# Patient Record
Sex: Female | Born: 1985 | Race: White | Hispanic: No | Marital: Single | Smoking: Never smoker
Health system: Southern US, Community
[De-identification: ages and names within clinical notes are randomized; demographics above are authoritative.]

## PROBLEM LIST (undated history)

## (undated) DIAGNOSIS — S0181XA Laceration without foreign body of other part of head, initial encounter: Secondary | ICD-10-CM

## (undated) DIAGNOSIS — S022XXA Fracture of nasal bones, initial encounter for closed fracture: Secondary | ICD-10-CM

## (undated) HISTORY — PX: WISDOM TOOTH EXTRACTION: SHX21

---

## 2020-02-28 ENCOUNTER — Other Ambulatory Visit: Payer: Self-pay

## 2020-02-28 ENCOUNTER — Emergency Department (HOSPITAL_COMMUNITY)
Admission: EM | Admit: 2020-02-28 | Discharge: 2020-02-29 | Disposition: A | Discharge status: Home / Self Care | Payer: Managed Care, Other (non HMO) | Attending: Emergency Medicine | Admitting: Emergency Medicine

## 2020-02-28 ENCOUNTER — Encounter (HOSPITAL_COMMUNITY): Payer: Self-pay | Admitting: Emergency Medicine

## 2020-02-28 ENCOUNTER — Emergency Department (HOSPITAL_COMMUNITY): Payer: Managed Care, Other (non HMO)

## 2020-02-28 DIAGNOSIS — S022XXA Fracture of nasal bones, initial encounter for closed fracture: Secondary | ICD-10-CM | POA: Insufficient documentation

## 2020-02-28 DIAGNOSIS — Y999 Unspecified external cause status: Secondary | ICD-10-CM | POA: Diagnosis not present

## 2020-02-28 DIAGNOSIS — W1830XA Fall on same level, unspecified, initial encounter: Secondary | ICD-10-CM | POA: Diagnosis not present

## 2020-02-28 DIAGNOSIS — U071 COVID-19: Secondary | ICD-10-CM | POA: Diagnosis not present

## 2020-02-28 DIAGNOSIS — Y9301 Activity, walking, marching and hiking: Secondary | ICD-10-CM | POA: Insufficient documentation

## 2020-02-28 DIAGNOSIS — R42 Dizziness and giddiness: Secondary | ICD-10-CM | POA: Insufficient documentation

## 2020-02-28 DIAGNOSIS — Z23 Encounter for immunization: Secondary | ICD-10-CM | POA: Diagnosis not present

## 2020-02-28 DIAGNOSIS — S0181XA Laceration without foreign body of other part of head, initial encounter: Secondary | ICD-10-CM | POA: Insufficient documentation

## 2020-02-28 DIAGNOSIS — Y929 Unspecified place or not applicable: Secondary | ICD-10-CM | POA: Insufficient documentation

## 2020-02-28 DIAGNOSIS — S022XXB Fracture of nasal bones, initial encounter for open fracture: Secondary | ICD-10-CM

## 2020-02-28 LAB — CBC WITH DIFFERENTIAL/PLATELET
Abs Immature Granulocytes: 0.07 10*3/uL (ref 0.00–0.07)
Basophils Absolute: 0.1 10*3/uL (ref 0.0–0.1)
Basophils Relative: 1 %
Eosinophils Absolute: 0.1 10*3/uL (ref 0.0–0.5)
Eosinophils Relative: 0 %
HCT: 38.7 % (ref 36.0–46.0)
Hemoglobin: 12.9 g/dL (ref 12.0–15.0)
Immature Granulocytes: 1 %
Lymphocytes Relative: 17 %
Lymphs Abs: 2.4 10*3/uL (ref 0.7–4.0)
MCH: 27.6 pg (ref 26.0–34.0)
MCHC: 33.3 g/dL (ref 30.0–36.0)
MCV: 82.9 fL (ref 80.0–100.0)
Monocytes Absolute: 0.8 10*3/uL (ref 0.1–1.0)
Monocytes Relative: 5 %
Neutro Abs: 10.9 10*3/uL — ABNORMAL HIGH (ref 1.7–7.7)
Neutrophils Relative %: 76 %
Platelets: 340 10*3/uL (ref 150–400)
RBC: 4.67 MIL/uL (ref 3.87–5.11)
RDW: 13.2 % (ref 11.5–15.5)
WBC: 14.2 10*3/uL — ABNORMAL HIGH (ref 4.0–10.5)
nRBC: 0 % (ref 0.0–0.2)

## 2020-02-28 LAB — BASIC METABOLIC PANEL
Anion gap: 12 (ref 5–15)
BUN: 9 mg/dL (ref 6–20)
CO2: 22 mmol/L (ref 22–32)
Calcium: 9.7 mg/dL (ref 8.9–10.3)
Chloride: 105 mmol/L (ref 98–111)
Creatinine, Ser: 0.94 mg/dL (ref 0.44–1.00)
GFR calc Af Amer: >60 mL/min (ref >60)
GFR calc non Af Amer: >60 mL/min (ref >60)
Glucose, Bld: 123 mg/dL — ABNORMAL HIGH (ref 70–99)
Potassium: 3.9 mmol/L (ref 3.5–5.1)
Sodium: 139 mmol/L (ref 135–145)

## 2020-02-28 LAB — I-STAT BETA HCG BLOOD, ED (MC, WL, AP ONLY): I-stat hCG, quantitative: <5 m[IU]/mL (ref <5)

## 2020-02-28 MED ORDER — LIDOCAINE-EPINEPHRINE (PF) 2 %-1:200000 IJ SOLN
INTRAMUSCULAR | Status: AC
  Filled 2020-02-28: qty 20

## 2020-02-28 MED ORDER — HYDROCODONE-ACETAMINOPHEN 5-325 MG PO TABS
1.0000 | ORAL_TABLET | Freq: Once | ORAL | Status: AC
  Administered 2020-02-28: 1 via ORAL
  Filled 2020-02-28: qty 1

## 2020-02-28 MED ORDER — TETANUS-DIPHTH-ACELL PERTUSSIS 5-2.5-18.5 LF-MCG/0.5 IM SUSP
0.5000 mL | Freq: Once | INTRAMUSCULAR | Status: AC
  Administered 2020-02-28: 0.5 mL via INTRAMUSCULAR
  Filled 2020-02-28: qty 0.5

## 2020-02-28 MED ORDER — CEFAZOLIN SODIUM-DEXTROSE 2-4 GM/100ML-% IV SOLN
2.0000 g | Freq: Once | INTRAVENOUS | Status: AC
  Administered 2020-02-28: 2 g via INTRAVENOUS
  Filled 2020-02-28: qty 100

## 2020-02-28 MED ORDER — LIDOCAINE-EPINEPHRINE (PF) 1 %-1:200000 IJ SOLN
20.0000 mL | Freq: Once | INTRAMUSCULAR | Status: DC
  Filled 2020-02-28: qty 20

## 2020-02-28 NOTE — ED Notes (Signed)
Pt is in triage 

## 2020-02-28 NOTE — ED Triage Notes (Signed)
Pt brought to ED by EMS after having a fall, pt landed on her face, extensive laceration noticed to pt's left side of her face mostly close to her nose and lip. Nose deformity noticed.

## 2020-02-28 NOTE — ED Provider Notes (Signed)
[facility] EMERGENCY DEPARTMENT Provider Note   CSN: [id number] Arrival date & time: 02/28/20  2020     History Chief Complaint  Patient presents with  . Fall    Julie Brock is a 34 y.o. female with no significant past medical history who presents to the ED after a mechanical fall that occurred just prior to arrival. Patient states she tripped over an uneven ground where her shoe got caught causing her to fall and hit her face. She was wearing glasses which penetrated her skin.  Denies loss of consciousness.  She is not currently any blood thinners.  Denies nausea and vomiting following the accident.  She rates her pain a 3/10.  No treatment prior to arrival. Denies headache, neck and back pain. Unsure when last tetanus shot was. Admits to some dizziness because she isn't wearing her glasses. Pain is worse with palpation.   History obtained from patient and past medical records. No interpreter used during encounter.      History reviewed. No pertinent past medical history.  There are no problems to display for this patient.   History reviewed. No pertinent surgical history.   OB History   No obstetric history on file.     No family history on file.  Social History   Tobacco Use  . Smoking status: Never Smoker  . Smokeless tobacco: Never Used  Substance Use Topics  . Alcohol use: Never  . Drug use: Never    Home Medications Prior to Admission medications   Not on File    Allergies    Patient has no known allergies.  Review of Systems   Review of Systems  Musculoskeletal: Negative for back pain and neck pain.  Skin: Positive for color change and wound.  Neurological: Positive for dizziness. Negative for facial asymmetry, speech difficulty and headaches.  All other systems reviewed and are negative.   Physical Exam Updated Vital Signs BP (!) 139/100   Pulse 85   Temp 98.8 F (37.1 C) (Oral)   Resp (!) 22   Ht 5\' 6"  (1.676 m)   Wt  104.3 kg   SpO2 98%   BMI 37.12 kg/m   Physical Exam Vitals and nursing note reviewed.  Constitutional:      General: She is not in acute distress.    Appearance: She is not toxic-appearing.  HENT:     Head: Normocephalic.     Comments: No battle sign, raccoon eyes, hemotympanum, or malocclusion.    Nose:     Comments: 2cm deep laceration into left nasal ala which almost goes through to mucus membranes with slight fascia barrier. Numerous superficial lacerations to forearm and nose.     Mouth/Throat:     Comments: No visible dental trauma.  Eyes:     Extraocular Movements: Extraocular movements intact.     Pupils: Pupils are equal, round, and reactive to light.  Neck:     Comments: No cervical midline tenderness. Cardiovascular:     Rate and Rhythm: Normal rate and regular rhythm.     Pulses: Normal pulses.     Heart sounds: Normal heart sounds. No murmur. No friction rub. No gallop.   Pulmonary:     Effort: Pulmonary effort is normal.     Breath sounds: Normal breath sounds.  Abdominal:     General: Abdomen is flat. Bowel sounds are normal. There is no distension.     Palpations: Abdomen is soft.     Tenderness: There is no  abdominal tenderness. There is no guarding or rebound.  Musculoskeletal:     Cervical back: Neck supple.     Comments: No thoracic or lumbar midline tenderness.   Skin:    General: Skin is warm and dry.  Neurological:     General: No focal deficit present.     Mental Status: She is alert.     Comments: Speech is clear, able to follow commands CN III-XII intact Normal strength in upper and lower extremities bilaterally including dorsiflexion and plantar flexion, strong and equal grip strength Sensation grossly intact throughout Moves extremities without ataxia, coordination intact No pronator drift   Psychiatric:        Mood and Affect: Mood normal.        Behavior: Behavior normal.           ED Results / Procedures / Treatments    Labs (all labs ordered are listed, but only abnormal results are displayed) Labs Reviewed  CBC WITH DIFFERENTIAL/PLATELET - Abnormal; Notable for the following components:      Result Value   WBC 14.2 (*)    Neutro Abs 10.9 (*)    All other components within normal limits  BASIC METABOLIC PANEL - Abnormal; Notable for the following components:   Glucose, Bld 123 (*)    All other components within normal limits  SARS CORONAVIRUS 2 BY RT PCR (HOSPITAL ORDER, PERFORMED IN [facility])  I-STAT BETA HCG BLOOD, ED ([facility], [facility], [facility] ONLY)    EKG EKG Interpretation  Date/Time:  Sunday Feb 28 2020 20:23:09 EDT Ventricular Rate:  84 PR Interval:  176 QRS Duration: 74 QT Interval:  374 QTC Calculation: 441 R Axis:   63 Text Interpretation: Normal sinus rhythm Normal ECG No old tracing to compare Confirmed by Melene PlanFloyd, Dan [phone]([id number]) on 02/28/2020 9:44:37 PM   Radiology CT Head Wo Contrast  Result Date: 02/28/2020 CLINICAL DATA:  34 year old female with facial trauma. EXAM: CT HEAD WITHOUT CONTRAST CT MAXILLOFACIAL WITHOUT CONTRAST CT CERVICAL SPINE WITHOUT CONTRAST TECHNIQUE: Multidetector CT imaging of the head, cervical spine, and maxillofacial structures were performed using the standard protocol without intravenous contrast. Multiplanar CT image reconstructions of the cervical spine and maxillofacial structures were also generated. COMPARISON:  None. FINDINGS: CT HEAD FINDINGS Brain: No evidence of acute infarction, hemorrhage, hydrocephalus, extra-axial collection or mass lesion/mass effect. Vascular: No hyperdense vessel or unexpected calcification. Skull: Normal. Negative for fracture or focal lesion. Other: Minimal left forehead contusion. CT MAXILLOFACIAL FINDINGS Osseous: There are fractures of the bilateral nasal bone and anterior nasal septum with mild deviation of the nose to the right. No other acute fracture identified. No mandibular subluxation. Orbits: The globes and  retro-orbital fat are preserved. Sinuses: There is blood product in the anterior nasal passages. The paranasal sinuses and mastoid air cells are otherwise clear. Soft tissues: There is laceration of the skin over the nose and laceration of the soft tissues of the left side of the nose. Multiple tiny radiopaque fragment in the soft tissues of the left nasal fold and left upper lip concerning for retained foreign fragments. Clinical correlation recommended. CT CERVICAL SPINE FINDINGS Alignment: No acute subluxation. There is straightening of normal cervical lordosis. This may be positional or due to muscle spasm. Skull base and vertebrae: No acute fracture Soft tissues and spinal canal: No prevertebral fluid or swelling. No visible canal hematoma. Disc levels: Mild degenerative changes primarily at C4-C5 and C5-C6 with bone spurring. Upper chest: Negative. Other: Bilateral mildly enlarged cervical  lymph nodes measuring up to 13 mm (46/4) on the left. This is indeterminate in etiology. Clinical correlation is recommended. There is heterogeneity of the thyroid gland with probable several nodules. Recommend thyroid US (Ref: J Am Coll Radiol. 2015 Feb;12(2): 143-50). IMPRESSION: 1. Normal unenhanced CT of the brain. 2. No acute/traumatic cervical spine pathology. 3. Fractures of the bilateral nasal bone and anterior nasal septum with mild deviation of the nose to the right. 4. Multiple tiny radiopaque fragment in the soft tissues of the left nasal fold and left upper lip concerning for retained foreign fragments. Clinical correlation recommended. 5. Heterogeneous thyroid gland with multiple nodules. Further evaluation with ultrasound on a nonemergent/outpatient basis recommended. 6. Bilateral cervical adenopathy of indeterminate etiology. Clinical correlation is recommended. Electronically Signed   By: Elgie Collard M.D.   On: 02/28/2020 21:33   CT Cervical Spine Wo Contrast  Result Date: 02/28/2020 CLINICAL DATA:   34 year old female with facial trauma. EXAM: CT HEAD WITHOUT CONTRAST CT MAXILLOFACIAL WITHOUT CONTRAST CT CERVICAL SPINE WITHOUT CONTRAST TECHNIQUE: Multidetector CT imaging of the head, cervical spine, and maxillofacial structures were performed using the standard protocol without intravenous contrast. Multiplanar CT image reconstructions of the cervical spine and maxillofacial structures were also generated. COMPARISON:  None. FINDINGS: CT HEAD FINDINGS Brain: No evidence of acute infarction, hemorrhage, hydrocephalus, extra-axial collection or mass lesion/mass effect. Vascular: No hyperdense vessel or unexpected calcification. Skull: Normal. Negative for fracture or focal lesion. Other: Minimal left forehead contusion. CT MAXILLOFACIAL FINDINGS Osseous: There are fractures of the bilateral nasal bone and anterior nasal septum with mild deviation of the nose to the right. No other acute fracture identified. No mandibular subluxation. Orbits: The globes and retro-orbital fat are preserved. Sinuses: There is blood product in the anterior nasal passages. The paranasal sinuses and mastoid air cells are otherwise clear. Soft tissues: There is laceration of the skin over the nose and laceration of the soft tissues of the left side of the nose. Multiple tiny radiopaque fragment in the soft tissues of the left nasal fold and left upper lip concerning for retained foreign fragments. Clinical correlation recommended. CT CERVICAL SPINE FINDINGS Alignment: No acute subluxation. There is straightening of normal cervical lordosis. This may be positional or due to muscle spasm. Skull base and vertebrae: No acute fracture Soft tissues and spinal canal: No prevertebral fluid or swelling. No visible canal hematoma. Disc levels: Mild degenerative changes primarily at C4-C5 and C5-C6 with bone spurring. Upper chest: Negative. Other: Bilateral mildly enlarged cervical lymph nodes measuring up to 13 mm (46/4) on the left. This is  indeterminate in etiology. Clinical correlation is recommended. There is heterogeneity of the thyroid gland with probable several nodules. Recommend thyroid US (Ref: J Am Coll Radiol. 2015 Feb;12(2): 143-50). IMPRESSION: 1. Normal unenhanced CT of the brain. 2. No acute/traumatic cervical spine pathology. 3. Fractures of the bilateral nasal bone and anterior nasal septum with mild deviation of the nose to the right. 4. Multiple tiny radiopaque fragment in the soft tissues of the left nasal fold and left upper lip concerning for retained foreign fragments. Clinical correlation recommended. 5. Heterogeneous thyroid gland with multiple nodules. Further evaluation with ultrasound on a nonemergent/outpatient basis recommended. 6. Bilateral cervical adenopathy of indeterminate etiology. Clinical correlation is recommended. Electronically Signed   By: Elgie Collard M.D.   On: 02/28/2020 21:33   CT Maxillofacial Wo Contrast  Result Date: 02/28/2020 CLINICAL DATA:  34 year old female with facial trauma. EXAM: CT HEAD WITHOUT CONTRAST CT MAXILLOFACIAL WITHOUT CONTRAST  CT CERVICAL SPINE WITHOUT CONTRAST TECHNIQUE: Multidetector CT imaging of the head, cervical spine, and maxillofacial structures were performed using the standard protocol without intravenous contrast. Multiplanar CT image reconstructions of the cervical spine and maxillofacial structures were also generated. COMPARISON:  None. FINDINGS: CT HEAD FINDINGS Brain: No evidence of acute infarction, hemorrhage, hydrocephalus, extra-axial collection or mass lesion/mass effect. Vascular: No hyperdense vessel or unexpected calcification. Skull: Normal. Negative for fracture or focal lesion. Other: Minimal left forehead contusion. CT MAXILLOFACIAL FINDINGS Osseous: There are fractures of the bilateral nasal bone and anterior nasal septum with mild deviation of the nose to the right. No other acute fracture identified. No mandibular subluxation. Orbits: The globes  and retro-orbital fat are preserved. Sinuses: There is blood product in the anterior nasal passages. The paranasal sinuses and mastoid air cells are otherwise clear. Soft tissues: There is laceration of the skin over the nose and laceration of the soft tissues of the left side of the nose. Multiple tiny radiopaque fragment in the soft tissues of the left nasal fold and left upper lip concerning for retained foreign fragments. Clinical correlation recommended. CT CERVICAL SPINE FINDINGS Alignment: No acute subluxation. There is straightening of normal cervical lordosis. This may be positional or due to muscle spasm. Skull base and vertebrae: No acute fracture Soft tissues and spinal canal: No prevertebral fluid or swelling. No visible canal hematoma. Disc levels: Mild degenerative changes primarily at C4-C5 and C5-C6 with bone spurring. Upper chest: Negative. Other: Bilateral mildly enlarged cervical lymph nodes measuring up to 13 mm (46/4) on the left. This is indeterminate in etiology. Clinical correlation is recommended. There is heterogeneity of the thyroid gland with probable several nodules. Recommend thyroid US (Ref: J Am Coll Radiol. 2015 Feb;12(2): 143-50). IMPRESSION: 1. Normal unenhanced CT of the brain. 2. No acute/traumatic cervical spine pathology. 3. Fractures of the bilateral nasal bone and anterior nasal septum with mild deviation of the nose to the right. 4. Multiple tiny radiopaque fragment in the soft tissues of the left nasal fold and left upper lip concerning for retained foreign fragments. Clinical correlation recommended. 5. Heterogeneous thyroid gland with multiple nodules. Further evaluation with ultrasound on a nonemergent/outpatient basis recommended. 6. Bilateral cervical adenopathy of indeterminate etiology. Clinical correlation is recommended. Electronically Signed   By: Anner Crete M.D.   On: 02/28/2020 21:33    Procedures Procedures (including critical care  time)  Medications Ordered in ED Medications  lidocaine-EPINEPHrine (XYLOCAINE-EPINEPHrine) 1 %-1:200000 (PF) injection 20 mL (20 mLs Infiltration Not Given 02/28/20 2307)  ceFAZolin (ANCEF) IVPB 2g/100 mL premix (0 g Intravenous Stopped 02/28/20 2302)  Tdap (BOOSTRIX) injection 0.5 mL (0.5 mLs Intramuscular Given 02/28/20 2206)  HYDROcodone-acetaminophen (NORCO/VICODIN) 5-325 MG per tablet 1 tablet (1 tablet Oral Given 02/28/20 2203)  lidocaine-EPINEPHrine (XYLOCAINE W/EPI) 2 %-1:200000 (PF) injection (  Given 02/28/20 2307)    ED Course  I have reviewed the triage vital signs and the nursing notes.  Pertinent labs & imaging results that were available during my care of the patient were reviewed by me and considered in my medical decision making (see chart for details).  Clinical Course as of Feb 28 2352  Sun Feb 28, 2020  2213 Discussed case with Dr. Marla Roe who agrees to see patient for suture repair.    [CA]    Clinical Course User Index [CA] Suzy Bouchard, PA-C   MDM Rules/Calculators/A&P  34 year old female presents to the ED via EMS after a mechanical fall that occurred just prior to arrival. Patient fell directly on her face causing numerous lacerations to her face, most significant around left nasal ala.  No loss of consciousness.  She is not currently on any blood thinners.  Stable vitals.  Patient no acute distress and nontoxic-appearing.  2 cm deep laceration into left nasal ala that almost goes through to the mucous membrane.  Numerous superficial lacerations to forehead and bridge of nose.  No signs of basilar skull fracture on exam.  No cervical, thoracic, or lumbar midline tenderness.  Normal neurological exam.  Labs and images ordered at triage.  Tetanus shot updated here in the ED.  Patient started on IV Ancef.  Hydrocodone given for pain management.  Discussed case with Dr. Ulice Bold who agrees to repair patient in the ED. CT maxillofacial,  cervical spine, and head personally reviewed which demonstrates: IMPRESSION:  1. Normal unenhanced CT of the brain.  2. No acute/traumatic cervical spine pathology.  3. Fractures of the bilateral nasal bone and anterior nasal septum  with mild deviation of the nose to the right.  4. Multiple tiny radiopaque fragment in the soft tissues of the left  nasal fold and left upper lip concerning for retained foreign  fragments. Clinical correlation recommended.  5. Heterogeneous thyroid gland with multiple nodules. Further  evaluation with ultrasound on a nonemergent/outpatient basis  recommended.  6. Bilateral cervical adenopathy of indeterminate etiology. Clinical  correlation is recommended.   CBC reassuring with mild leukocytosis at 14.2 likely due to stress reaction.  BMP reassuring with normal renal function no major electrolyte derangements.  Pregnancy test negative.  Covid test pending.  EKG personally reviewed which demonstrates normal sinus rhythm with no signs of acute ischemia.  Discussed case with Dr. Adela Lank who evaluated patient at bedside and agrees with assessment and plan.   Dr. Ulice Bold performed suture repair. Will discharge patient with antibiotics and pain medication. ENT and Dr. Kittie Plater number given to patient at discharge. Strict ED precautions discussed with patient. Patient states understanding and agrees to plan. Patient discharged home in no acute distress and stable vitals  Final Clinical Impression(s) / ED Diagnoses Final diagnoses:  Facial laceration, initial encounter  Open fracture of nasal bone, initial encounter    Rx / DC Orders ED Discharge Orders    None       Mannie Stabile, PA-C 02/29/20 0014    Melene Plan, DO 02/29/20 1504    Melene Plan, DO 02/29/20 1506

## 2020-02-29 LAB — SARS CORONAVIRUS 2 BY RT PCR (HOSPITAL ORDER, PERFORMED IN ~~LOC~~ HOSPITAL LAB): SARS Coronavirus 2: POSITIVE — AB

## 2020-02-29 MED ORDER — NAPROXEN 500 MG PO TABS: 500.0000 mg | ORAL_TABLET | Freq: Two times a day (BID) | ORAL | 0 refills | Status: AC

## 2020-02-29 MED ORDER — AMOXICILLIN-POT CLAVULANATE 875-125 MG PO TABS
1.0000 | ORAL_TABLET | Freq: Two times a day (BID) | ORAL | 0 refills | Status: DC
Start: 2020-02-29 — End: 2020-03-30

## 2020-02-29 MED ORDER — HYDROCODONE-ACETAMINOPHEN 5-325 MG PO TABS
1.0000 | ORAL_TABLET | Freq: Once | ORAL | Status: AC
  Administered 2020-02-29: 1 via ORAL
  Filled 2020-02-29: qty 1

## 2020-02-29 MED ORDER — HYDROCODONE-ACETAMINOPHEN 5-325 MG PO TABS: 1.0000 | ORAL_TABLET | ORAL | 0 refills | Status: AC | PRN

## 2020-02-29 NOTE — Discharge Instructions (Addendum)
As discussed, I have included the number for Dr. Ulice Bold. Please call on Tuesday to schedule an appointment. I have also included the number of the ENT doctor. Call to schedule an appointment for further evaluation of your broken nose. I am sending you home with antibiotics and pain medication. Take as prescribed. Return to the ER for new or worsening symptoms.   You tested positive for COVID-19 today.  You need to quarantine at home for a total of 10 days from when your symptoms began.  If you are not having any symptoms prior to today, it would be 10 days from today.  You can take Tylenol and ibuprofen if you develop a fever.  Return to the emergency department if you develop trouble breathing, if your fingers or lips turn blue, if you pass out, if you develop uncontrollable vomiting become unable to eat or stop making urine, or other new, concerning symptoms.

## 2020-02-29 NOTE — ED Notes (Signed)
Discharge instructions discussed with pt. Instructions included prescription, pain management and follow up care. Pt' COVID+. Discussed quarantine required. Pt to go home with significant other at bedside.

## 2020-03-01 ENCOUNTER — Other Ambulatory Visit: Payer: Self-pay

## 2020-03-01 ENCOUNTER — Ambulatory Visit (INDEPENDENT_AMBULATORY_CARE_PROVIDER_SITE_OTHER): Payer: Managed Care, Other (non HMO) | Admitting: Plastic Surgery

## 2020-03-01 ENCOUNTER — Telehealth: Payer: Self-pay | Admitting: Physician Assistant

## 2020-03-01 ENCOUNTER — Encounter: Payer: Self-pay | Admitting: Plastic Surgery

## 2020-03-01 DIAGNOSIS — S022XXA Fracture of nasal bones, initial encounter for closed fracture: Secondary | ICD-10-CM | POA: Insufficient documentation

## 2020-03-01 DIAGNOSIS — S0181XA Laceration without foreign body of other part of head, initial encounter: Secondary | ICD-10-CM

## 2020-03-01 NOTE — Procedures (Signed)
DATE OF OPERATION: 03/01/2020  LOCATION: [facility]  PREOPERATIVE DIAGNOSIS: Facial lacerations  POSTOPERATIVE DIAGNOSIS: Same  PROCEDURE: Complex repair of facial lacerations 1. Complex repair of left nasal ala laceration 2 cm 2. Repair of left nasal wall laceration 3 cm 3. Repair of oral buccal laceration 1 cm 4. Repair of upper lip laceration 1 cm  SURGEON: Claire Sanger Dillingham, DO  EBL: 2 cc  CONDITION: Stable  COMPLICATIONS: None  INDICATION: The patient, Julie Brock, is a 34 y.o. female born on 02-Oct-1985, is here for treatment of complex facial lacerations after falling on her face and sustaining cuts from her glasses.  She also has a nasal fracture.   PROCEDURE DETAILS:  The patient was seen in the emergency room.  The procedure was explained and consent obtained. A standard time out was performed and all information was confirmed.   The patient was placed in a comfortable supine position.  Her face was prepped with Betadine.  Lidocaine 1% with epinephrine was injected in the laceration sites of the nose, forehead and upper lip.  The areas were further cleaned with Betadine due to some gravel in the wounds.  The forehead abrasion was cleaned but sutures were not placed.  There were additionally 2 areas on the nose that had skin missing and therefore the areas were cleaned and not closed with sutures.  Left nasal ala: There was a 2 cm area of stellate laceration.  There was destruction of a portion of the tissue.  Approximately 5 mm was trimmed.  Special care was taken to realign the structures.  There was a portion of the nasal ala at the inferior most aspect at the nasal opening that was lacerated.  A deep layer of 5-0 Vicryl was used to reapproximate the muscle layer of the upper lip to the lower nasal ala insertion.  The skin was reapproximated at the lateral nasal ala using 5-0 Monocryl sutures.  Oral buccal mucosa: A 4-0 Vicryl was used to close the 1 cm buccal  mucosa laceration on the maxillary aspect.  This communicated with the nasal ala laceration.  Left nasal sidewall: The 3 cm laceration was reapproximated using 5-0 Monocryl simple interrupted sutures due to the tension and irregularity of the laceration.  Upper lip: The stellate laceration was repaired with a combination of 5-0 Monocryl and 5-0 Vicryl as the vermilion border was involved in the 1 cm laceration.  All areas were dressed with Vaseline and dry gauze.  The patient was allowed to wake up and taken to recovery room in stable condition at the end of the case. The family was notified at the end of the case.   The 21st Century Cures Act was signed into law in 2016 which includes the topic of electronic health records.  This provides immediate access to information in MyChart.  This includes consultation notes, operative notes, office notes, lab results and pathology reports.  If you have any questions about what you read please let us know at your next visit or call us at the office.  We are right here with you.

## 2020-03-01 NOTE — Telephone Encounter (Signed)
Called to discuss with Edmon Crape about Covid symptoms and the use of bamlanivimab/etesevimab or casirivimab/imdevimab, a monoclonal antibody infusion for those with mild to moderate Covid symptoms and at a high risk of hospitalization.    Pt does not qualify for infusion therapy as pt has asymptomatic infection. Isolation precautions discussed. Advised to contact back for consideration should they develop symptoms. Patient verbalized understanding. If she goes on to develop symptoms, she has our hotline number. (qualification is BMI >25)     Patient Active Problem List   Diagnosis Date Noted  . Facial laceration 03/01/2020  . Nasal fracture 03/01/2020    Cline Crock PA-C

## 2020-03-01 NOTE — Consult Note (Signed)
Reason for Consult: Facial trauma Referring Physician: Dr. Eliseo Squires  Julie Brock is an 34 y.o. female.  HPI:  The patient is a 34 yrs old wf here for treatment of her facial injuries.  She was playing miniature golf when she tripped and fell.  She sustained multiple facial lacerations from her glasses.  She had abrasions on her forehead, upper lip and nose.  There were lacerations on her left lateral nose and upper lip  History reviewed. No pertinent past medical history.  History reviewed. No pertinent surgical history.  No family history on file.  Social History:  reports that she has never smoked. She has never used smokeless tobacco. She reports that she does not drink alcohol or use drugs.  Allergies: No Known Allergies  Medications: I have reviewed the patient's current medications.  Results for orders placed or performed during the hospital encounter of 02/28/20 (from the past 48 hour(s))  CBC with Differential     Status: Abnormal   Collection Time: 02/28/20  9:28 PM  Result Value Ref Range   WBC 14.2 (H) 4.0 - 10.5 K/uL   RBC 4.67 3.87 - 5.11 MIL/uL   Hemoglobin 12.9 12.0 - 15.0 g/dL   HCT 38.7 36.0 - 46.0 %   MCV 82.9 80.0 - 100.0 fL   MCH 27.6 26.0 - 34.0 pg   MCHC 33.3 30.0 - 36.0 g/dL   RDW 13.2 11.5 - 15.5 %   Platelets 340 150 - 400 K/uL   nRBC 0.0 0.0 - 0.2 %   Neutrophils Relative % 76 %   Neutro Abs 10.9 (H) 1.7 - 7.7 K/uL   Lymphocytes Relative 17 %   Lymphs Abs 2.4 0.7 - 4.0 K/uL   Monocytes Relative 5 %   Monocytes Absolute 0.8 0.1 - 1.0 K/uL   Eosinophils Relative 0 %   Eosinophils Absolute 0.1 0.0 - 0.5 K/uL   Basophils Relative 1 %   Basophils Absolute 0.1 0.0 - 0.1 K/uL   Immature Granulocytes 1 %   Abs Immature Granulocytes 0.07 0.00 - 0.07 K/uL    Comment: Performed at [facility], [address], [place], [place] [address]  Basic metabolic panel     Status: Abnormal   Collection Time: 02/28/20  9:28 PM  Result Value Ref Range   Sodium 139 135 - 145 mmol/L   Potassium 3.9 3.5 - 5.1 mmol/L   Chloride 105 98 - 111 mmol/L   CO2 22 22 - 32 mmol/L   Glucose, Bld 123 (H) 70 - 99 mg/dL    Comment: Glucose reference range applies only to samples taken after fasting for at least 8 hours.   BUN 9 6 - 20 mg/dL   Creatinine, Ser 0.94 0.44 - 1.00 mg/dL   Calcium 9.7 8.9 - 10.3 mg/dL   GFR calc non Af Amer >60 >60 mL/min   GFR calc Af Amer >60 >60 mL/min   Anion gap 12 5 - 15    Comment: Performed at [facility] [address] [place], [place] [address]  I-Stat Beta hCG blood, ED ([facility], [facility], [facility] only)     Status: None   Collection Time: 02/28/20  9:31 PM  Result Value Ref Range   I-stat hCG, quantitative <5.0 <5 mIU/mL   Comment 3            Comment:   GEST. AGE      CONC.  (mIU/mL)   <=1 WEEK  5 - 50     2 WEEKS       50 - 500     3 WEEKS       100 - 10,000     4 WEEKS     1,000 - 30,000        FEMALE AND NON-PREGNANT FEMALE:     LESS THAN 5 mIU/mL   SARS Coronavirus 2 by RT PCR (hospital order, performed in [facility] hospital lab) Nasopharyngeal Nasopharyngeal Swab     Status: Abnormal   Collection Time: 02/28/20 11:19 PM   Specimen: Nasopharyngeal Swab  Result Value Ref Range   SARS Coronavirus 2 POSITIVE (A) NEGATIVE    Comment: RESULT CALLED TO, READ BACK BY AND VERIFIED WITH: RN BAILEY OFORIO AT 0021 BY MESSAN H. ON 02/29/2020 (NOTE) SARS-CoV-2 target nucleic acids are DETECTED SARS-CoV-2 RNA is generally detectable in upper respiratory specimens  during the acute phase of infection.  Positive results are indicative  of the presence of the identified virus, but do not rule out bacterial infection or co-infection with other pathogens not detected by the test.  Clinical correlation with patient history and  other diagnostic information is necessary to determine patient infection status.  The expected result is negative. Fact Sheet for Patients:   BoilerBrush.com.cy  Fact  Sheet for Healthcare Providers:   https://pope.com/   This test is not yet approved or cleared by the [place] FDA and  has been authorized for detection and/or diagnosis of SARS-CoV-2 by FDA under an Emergency Use Authorization (EUA).  This EUA will remain in effect (meaning  this test can be used) for the duration of  the COVID-19 declaration under Section 564(b)(1) of the Act, 21 U.S.C. section 360-bbb-3(b)(1), unless the authorization is terminated or revoked sooner. Performed at [facility], [address] [place], [place] [address]     CT Head Wo Contrast  Result Date: 02/28/2020 CLINICAL DATA:  34 year old female with facial trauma. EXAM: CT HEAD WITHOUT CONTRAST CT MAXILLOFACIAL WITHOUT CONTRAST CT CERVICAL SPINE WITHOUT CONTRAST TECHNIQUE: Multidetector CT imaging of the head, cervical spine, and maxillofacial structures were performed using the standard protocol without intravenous contrast. Multiplanar CT image reconstructions of the cervical spine and maxillofacial structures were also generated. COMPARISON:  None. FINDINGS: CT HEAD FINDINGS Brain: No evidence of acute infarction, hemorrhage, hydrocephalus, extra-axial collection or mass lesion/mass effect. Vascular: No hyperdense vessel or unexpected calcification. Skull: Normal. Negative for fracture or focal lesion. Other: Minimal left forehead contusion. CT MAXILLOFACIAL FINDINGS Osseous: There are fractures of the bilateral nasal bone and anterior nasal septum with mild deviation of the nose to the right. No other acute fracture identified. No mandibular subluxation. Orbits: The globes and retro-orbital fat are preserved. Sinuses: There is blood product in the anterior nasal passages. The paranasal sinuses and mastoid air cells are otherwise clear. Soft tissues: There is laceration of the skin over the nose and laceration of the soft tissues of the left side of the nose. Multiple tiny radiopaque  fragment in the soft tissues of the left nasal fold and left upper lip concerning for retained foreign fragments. Clinical correlation recommended. CT CERVICAL SPINE FINDINGS Alignment: No acute subluxation. There is straightening of normal cervical lordosis. This may be positional or due to muscle spasm. Skull base and vertebrae: No acute fracture Soft tissues and spinal canal: No prevertebral fluid or swelling. No visible canal hematoma. Disc levels: Mild degenerative changes primarily at C4-C5 and C5-C6 with bone spurring. Upper chest:  Negative. Other: Bilateral mildly enlarged cervical lymph nodes measuring up to 13 mm (46/4) on the left. This is indeterminate in etiology. Clinical correlation is recommended. There is heterogeneity of the thyroid gland with probable several nodules. Recommend thyroid US (Ref: J Am Coll Radiol. 2015 Feb;12(2): 143-50). IMPRESSION: 1. Normal unenhanced CT of the brain. 2. No acute/traumatic cervical spine pathology. 3. Fractures of the bilateral nasal bone and anterior nasal septum with mild deviation of the nose to the right. 4. Multiple tiny radiopaque fragment in the soft tissues of the left nasal fold and left upper lip concerning for retained foreign fragments. Clinical correlation recommended. 5. Heterogeneous thyroid gland with multiple nodules. Further evaluation with ultrasound on a nonemergent/outpatient basis recommended. 6. Bilateral cervical adenopathy of indeterminate etiology. Clinical correlation is recommended. Electronically Signed   By: Elgie Collard M.D.   On: 02/28/2020 21:33   CT Cervical Spine Wo Contrast  Result Date: 02/28/2020 CLINICAL DATA:  34 year old female with facial trauma. EXAM: CT HEAD WITHOUT CONTRAST CT MAXILLOFACIAL WITHOUT CONTRAST CT CERVICAL SPINE WITHOUT CONTRAST TECHNIQUE: Multidetector CT imaging of the head, cervical spine, and maxillofacial structures were performed using the standard protocol without intravenous contrast.  Multiplanar CT image reconstructions of the cervical spine and maxillofacial structures were also generated. COMPARISON:  None. FINDINGS: CT HEAD FINDINGS Brain: No evidence of acute infarction, hemorrhage, hydrocephalus, extra-axial collection or mass lesion/mass effect. Vascular: No hyperdense vessel or unexpected calcification. Skull: Normal. Negative for fracture or focal lesion. Other: Minimal left forehead contusion. CT MAXILLOFACIAL FINDINGS Osseous: There are fractures of the bilateral nasal bone and anterior nasal septum with mild deviation of the nose to the right. No other acute fracture identified. No mandibular subluxation. Orbits: The globes and retro-orbital fat are preserved. Sinuses: There is blood product in the anterior nasal passages. The paranasal sinuses and mastoid air cells are otherwise clear. Soft tissues: There is laceration of the skin over the nose and laceration of the soft tissues of the left side of the nose. Multiple tiny radiopaque fragment in the soft tissues of the left nasal fold and left upper lip concerning for retained foreign fragments. Clinical correlation recommended. CT CERVICAL SPINE FINDINGS Alignment: No acute subluxation. There is straightening of normal cervical lordosis. This may be positional or due to muscle spasm. Skull base and vertebrae: No acute fracture Soft tissues and spinal canal: No prevertebral fluid or swelling. No visible canal hematoma. Disc levels: Mild degenerative changes primarily at C4-C5 and C5-C6 with bone spurring. Upper chest: Negative. Other: Bilateral mildly enlarged cervical lymph nodes measuring up to 13 mm (46/4) on the left. This is indeterminate in etiology. Clinical correlation is recommended. There is heterogeneity of the thyroid gland with probable several nodules. Recommend thyroid US (Ref: J Am Coll Radiol. 2015 Feb;12(2): 143-50). IMPRESSION: 1. Normal unenhanced CT of the brain. 2. No acute/traumatic cervical spine pathology. 3.  Fractures of the bilateral nasal bone and anterior nasal septum with mild deviation of the nose to the right. 4. Multiple tiny radiopaque fragment in the soft tissues of the left nasal fold and left upper lip concerning for retained foreign fragments. Clinical correlation recommended. 5. Heterogeneous thyroid gland with multiple nodules. Further evaluation with ultrasound on a nonemergent/outpatient basis recommended. 6. Bilateral cervical adenopathy of indeterminate etiology. Clinical correlation is recommended. Electronically Signed   By: Elgie Collard M.D.   On: 02/28/2020 21:33   CT Maxillofacial Wo Contrast  Result Date: 02/28/2020 CLINICAL DATA:  34 year old female with facial trauma. EXAM: CT HEAD  WITHOUT CONTRAST CT MAXILLOFACIAL WITHOUT CONTRAST CT CERVICAL SPINE WITHOUT CONTRAST TECHNIQUE: Multidetector CT imaging of the head, cervical spine, and maxillofacial structures were performed using the standard protocol without intravenous contrast. Multiplanar CT image reconstructions of the cervical spine and maxillofacial structures were also generated. COMPARISON:  None. FINDINGS: CT HEAD FINDINGS Brain: No evidence of acute infarction, hemorrhage, hydrocephalus, extra-axial collection or mass lesion/mass effect. Vascular: No hyperdense vessel or unexpected calcification. Skull: Normal. Negative for fracture or focal lesion. Other: Minimal left forehead contusion. CT MAXILLOFACIAL FINDINGS Osseous: There are fractures of the bilateral nasal bone and anterior nasal septum with mild deviation of the nose to the right. No other acute fracture identified. No mandibular subluxation. Orbits: The globes and retro-orbital fat are preserved. Sinuses: There is blood product in the anterior nasal passages. The paranasal sinuses and mastoid air cells are otherwise clear. Soft tissues: There is laceration of the skin over the nose and laceration of the soft tissues of the left side of the nose. Multiple tiny  radiopaque fragment in the soft tissues of the left nasal fold and left upper lip concerning for retained foreign fragments. Clinical correlation recommended. CT CERVICAL SPINE FINDINGS Alignment: No acute subluxation. There is straightening of normal cervical lordosis. This may be positional or due to muscle spasm. Skull base and vertebrae: No acute fracture Soft tissues and spinal canal: No prevertebral fluid or swelling. No visible canal hematoma. Disc levels: Mild degenerative changes primarily at C4-C5 and C5-C6 with bone spurring. Upper chest: Negative. Other: Bilateral mildly enlarged cervical lymph nodes measuring up to 13 mm (46/4) on the left. This is indeterminate in etiology. Clinical correlation is recommended. There is heterogeneity of the thyroid gland with probable several nodules. Recommend thyroid US (Ref: J Am Coll Radiol. 2015 Feb;12(2): 143-50). IMPRESSION: 1. Normal unenhanced CT of the brain. 2. No acute/traumatic cervical spine pathology. 3. Fractures of the bilateral nasal bone and anterior nasal septum with mild deviation of the nose to the right. 4. Multiple tiny radiopaque fragment in the soft tissues of the left nasal fold and left upper lip concerning for retained foreign fragments. Clinical correlation recommended. 5. Heterogeneous thyroid gland with multiple nodules. Further evaluation with ultrasound on a nonemergent/outpatient basis recommended. 6. Bilateral cervical adenopathy of indeterminate etiology. Clinical correlation is recommended. Electronically Signed   By: Elgie Collard M.D.   On: 02/28/2020 21:33    Review of Systems Blood pressure (!) 144/108, pulse 94, temperature 98 F (36.7 C), temperature source Oral, resp. rate 20, height 5\' 6"  (1.676 m), weight 104.3 kg, SpO2 97 %. Physical Exam  Constitutional: She is oriented to person, place, and time. She appears well-developed and well-nourished.  HENT:  Head: Normocephalic.    Eyes: Pupils are equal, round,  and reactive to light. EOM are normal.  Cardiovascular: Normal rate.  Respiratory: Effort normal.  GI: Soft. She exhibits no distension.  Musculoskeletal:        General: Normal range of motion.  Neurological: She is alert and oriented to person, place, and time.  Skin: Skin is warm.  Psychiatric: She has a normal mood and affect. Her behavior is normal. Judgment and thought content normal.    Assessment/Plan: The lacerations were repaired.  I would like to see the patient in the office in two days to apply donated Acell to the abrasions.  Keep the area covered till then.    Cheryle Dark 03/01/2020, 9:15 AM

## 2020-03-01 NOTE — H&P (View-Only) (Signed)
Reason for Consult: Facial trauma Referring Physician: Dr. Osorio Munoz  Julie Brock is an 34 y.o. female.  HPI:  The patient is a 34 yrs old wf here for treatment of her facial injuries.  She was playing miniature golf when she tripped and fell.  She sustained multiple facial lacerations from her glasses.  She had abrasions on her forehead, upper lip and nose.  There were lacerations on her left lateral nose and upper lip  History reviewed. No pertinent past medical history.  History reviewed. No pertinent surgical history.  No family history on file.  Social History:  reports that she has never smoked. She has never used smokeless tobacco. She reports that she does not drink alcohol or use drugs.  Allergies: No Known Allergies  Medications: I have reviewed the patient's current medications.  Results for orders placed or performed during the hospital encounter of 02/28/20 (from the past 48 hour(s))  CBC with Differential     Status: Abnormal   Collection Time: 02/28/20  9:28 PM  Result Value Ref Range   WBC 14.2 (H) 4.0 - 10.5 K/uL   RBC 4.67 3.87 - 5.11 MIL/uL   Hemoglobin 12.9 12.0 - 15.0 g/dL   HCT 38.7 36.0 - 46.0 %   MCV 82.9 80.0 - 100.0 fL   MCH 27.6 26.0 - 34.0 pg   MCHC 33.3 30.0 - 36.0 g/dL   RDW 13.2 11.5 - 15.5 %   Platelets 340 150 - 400 K/uL   nRBC 0.0 0.0 - 0.2 %   Neutrophils Relative % 76 %   Neutro Abs 10.9 (H) 1.7 - 7.7 K/uL   Lymphocytes Relative 17 %   Lymphs Abs 2.4 0.7 - 4.0 K/uL   Monocytes Relative 5 %   Monocytes Absolute 0.8 0.1 - 1.0 K/uL   Eosinophils Relative 0 %   Eosinophils Absolute 0.1 0.0 - 0.5 K/uL   Basophils Relative 1 %   Basophils Absolute 0.1 0.0 - 0.1 K/uL   Immature Granulocytes 1 %   Abs Immature Granulocytes 0.07 0.00 - 0.07 K/uL    Comment: Performed at [facility], [address] [place], [place] [address]  Basic metabolic panel     Status: Abnormal   Collection Time: 02/28/20  9:28 PM  Result Value Ref Range   Sodium 139 135 - 145 mmol/L   Potassium 3.9 3.5 - 5.1 mmol/L   Chloride 105 98 - 111 mmol/L   CO2 22 22 - 32 mmol/L   Glucose, Bld 123 (H) 70 - 99 mg/dL    Comment: Glucose reference range applies only to samples taken after fasting for at least 8 hours.   BUN 9 6 - 20 mg/dL   Creatinine, Ser 0.94 0.44 - 1.00 mg/dL   Calcium 9.7 8.9 - 10.3 mg/dL   GFR calc non Af Amer >60 >60 mL/min   GFR calc Af Amer >60 >60 mL/min   Anion gap 12 5 - 15    Comment: Performed at [facility], [address] , [place] [address]  I-Stat Beta hCG blood, ED ([facility], [facility], [facility] only)     Status: None   Collection Time: 02/28/20  9:31 PM  Result Value Ref Range   I-stat hCG, quantitative <5.0 <5 mIU/mL   Comment 3            Comment:   GEST. AGE      CONC.  (mIU/mL)   <=1 WEEK          5 - 50     2 WEEKS       50 - 500     3 WEEKS       100 - 10,000     4 WEEKS     1,000 - 30,000        FEMALE AND NON-PREGNANT FEMALE:     LESS THAN 5 mIU/mL   SARS Coronavirus 2 by RT PCR (hospital order, performed in [facility] hospital lab) Nasopharyngeal Nasopharyngeal Swab     Status: Abnormal   Collection Time: 02/28/20 11:19 PM   Specimen: Nasopharyngeal Swab  Result Value Ref Range   SARS Coronavirus 2 POSITIVE (A) NEGATIVE    Comment: RESULT CALLED TO, READ BACK BY AND VERIFIED WITH: RN BAILEY OFORIO AT 0021 BY MESSAN H. ON 02/29/2020 (NOTE) SARS-CoV-2 target nucleic acids are DETECTED SARS-CoV-2 RNA is generally detectable in upper respiratory specimens  during the acute phase of infection.  Positive results are indicative  of the presence of the identified virus, but do not rule out bacterial infection or co-infection with other pathogens not detected by the test.  Clinical correlation with patient history and  other diagnostic information is necessary to determine patient infection status.  The expected result is negative. Fact Sheet for Patients:   BoilerBrush.com.cy  Fact  Sheet for Healthcare Providers:   https://pope.com/   This test is not yet approved or cleared by the [place] FDA and  has been authorized for detection and/or diagnosis of SARS-CoV-2 by FDA under an Emergency Use Authorization (EUA).  This EUA will remain in effect (meaning  this test can be used) for the duration of  the COVID-19 declaration under Section 564(b)(1) of the Act, 21 U.S.C. section 360-bbb-3(b)(1), unless the authorization is terminated or revoked sooner. Performed at [facility], [address] [place], [place] [address]     CT Head Wo Contrast  Result Date: 02/28/2020 CLINICAL DATA:  34 year old female with facial trauma. EXAM: CT HEAD WITHOUT CONTRAST CT MAXILLOFACIAL WITHOUT CONTRAST CT CERVICAL SPINE WITHOUT CONTRAST TECHNIQUE: Multidetector CT imaging of the head, cervical spine, and maxillofacial structures were performed using the standard protocol without intravenous contrast. Multiplanar CT image reconstructions of the cervical spine and maxillofacial structures were also generated. COMPARISON:  None. FINDINGS: CT HEAD FINDINGS Brain: No evidence of acute infarction, hemorrhage, hydrocephalus, extra-axial collection or mass lesion/mass effect. Vascular: No hyperdense vessel or unexpected calcification. Skull: Normal. Negative for fracture or focal lesion. Other: Minimal left forehead contusion. CT MAXILLOFACIAL FINDINGS Osseous: There are fractures of the bilateral nasal bone and anterior nasal septum with mild deviation of the nose to the right. No other acute fracture identified. No mandibular subluxation. Orbits: The globes and retro-orbital fat are preserved. Sinuses: There is blood product in the anterior nasal passages. The paranasal sinuses and mastoid air cells are otherwise clear. Soft tissues: There is laceration of the skin over the nose and laceration of the soft tissues of the left side of the nose. Multiple tiny radiopaque  fragment in the soft tissues of the left nasal fold and left upper lip concerning for retained foreign fragments. Clinical correlation recommended. CT CERVICAL SPINE FINDINGS Alignment: No acute subluxation. There is straightening of normal cervical lordosis. This may be positional or due to muscle spasm. Skull base and vertebrae: No acute fracture Soft tissues and spinal canal: No prevertebral fluid or swelling. No visible canal hematoma. Disc levels: Mild degenerative changes primarily at C4-C5 and C5-C6 with bone spurring. Upper chest:  Negative. Other: Bilateral mildly enlarged cervical lymph nodes measuring up to 13 mm (46/4) on the left. This is indeterminate in etiology. Clinical correlation is recommended. There is heterogeneity of the thyroid gland with probable several nodules. Recommend thyroid US (Ref: J Am Coll Radiol. 2015 Feb;12(2): 143-50). IMPRESSION: 1. Normal unenhanced CT of the brain. 2. No acute/traumatic cervical spine pathology. 3. Fractures of the bilateral nasal bone and anterior nasal septum with mild deviation of the nose to the right. 4. Multiple tiny radiopaque fragment in the soft tissues of the left nasal fold and left upper lip concerning for retained foreign fragments. Clinical correlation recommended. 5. Heterogeneous thyroid gland with multiple nodules. Further evaluation with ultrasound on a nonemergent/outpatient basis recommended. 6. Bilateral cervical adenopathy of indeterminate etiology. Clinical correlation is recommended. Electronically Signed   By: Arash  Radparvar M.D.   On: 02/28/2020 21:33   CT Cervical Spine Wo Contrast  Result Date: 02/28/2020 CLINICAL DATA:  34-year-old female with facial trauma. EXAM: CT HEAD WITHOUT CONTRAST CT MAXILLOFACIAL WITHOUT CONTRAST CT CERVICAL SPINE WITHOUT CONTRAST TECHNIQUE: Multidetector CT imaging of the head, cervical spine, and maxillofacial structures were performed using the standard protocol without intravenous contrast.  Multiplanar CT image reconstructions of the cervical spine and maxillofacial structures were also generated. COMPARISON:  None. FINDINGS: CT HEAD FINDINGS Brain: No evidence of acute infarction, hemorrhage, hydrocephalus, extra-axial collection or mass lesion/mass effect. Vascular: No hyperdense vessel or unexpected calcification. Skull: Normal. Negative for fracture or focal lesion. Other: Minimal left forehead contusion. CT MAXILLOFACIAL FINDINGS Osseous: There are fractures of the bilateral nasal bone and anterior nasal septum with mild deviation of the nose to the right. No other acute fracture identified. No mandibular subluxation. Orbits: The globes and retro-orbital fat are preserved. Sinuses: There is blood product in the anterior nasal passages. The paranasal sinuses and mastoid air cells are otherwise clear. Soft tissues: There is laceration of the skin over the nose and laceration of the soft tissues of the left side of the nose. Multiple tiny radiopaque fragment in the soft tissues of the left nasal fold and left upper lip concerning for retained foreign fragments. Clinical correlation recommended. CT CERVICAL SPINE FINDINGS Alignment: No acute subluxation. There is straightening of normal cervical lordosis. This may be positional or due to muscle spasm. Skull base and vertebrae: No acute fracture Soft tissues and spinal canal: No prevertebral fluid or swelling. No visible canal hematoma. Disc levels: Mild degenerative changes primarily at C4-C5 and C5-C6 with bone spurring. Upper chest: Negative. Other: Bilateral mildly enlarged cervical lymph nodes measuring up to 13 mm (46/4) on the left. This is indeterminate in etiology. Clinical correlation is recommended. There is heterogeneity of the thyroid gland with probable several nodules. Recommend thyroid US (Ref: J Am Coll Radiol. 2015 Feb;12(2): 143-50). IMPRESSION: 1. Normal unenhanced CT of the brain. 2. No acute/traumatic cervical spine pathology. 3.  Fractures of the bilateral nasal bone and anterior nasal septum with mild deviation of the nose to the right. 4. Multiple tiny radiopaque fragment in the soft tissues of the left nasal fold and left upper lip concerning for retained foreign fragments. Clinical correlation recommended. 5. Heterogeneous thyroid gland with multiple nodules. Further evaluation with ultrasound on a nonemergent/outpatient basis recommended. 6. Bilateral cervical adenopathy of indeterminate etiology. Clinical correlation is recommended. Electronically Signed   By: Arash  Radparvar M.D.   On: 02/28/2020 21:33   CT Maxillofacial Wo Contrast  Result Date: 02/28/2020 CLINICAL DATA:  34-year-old female with facial trauma. EXAM: CT HEAD   WITHOUT CONTRAST CT MAXILLOFACIAL WITHOUT CONTRAST CT CERVICAL SPINE WITHOUT CONTRAST TECHNIQUE: Multidetector CT imaging of the head, cervical spine, and maxillofacial structures were performed using the standard protocol without intravenous contrast. Multiplanar CT image reconstructions of the cervical spine and maxillofacial structures were also generated. COMPARISON:  None. FINDINGS: CT HEAD FINDINGS Brain: No evidence of acute infarction, hemorrhage, hydrocephalus, extra-axial collection or mass lesion/mass effect. Vascular: No hyperdense vessel or unexpected calcification. Skull: Normal. Negative for fracture or focal lesion. Other: Minimal left forehead contusion. CT MAXILLOFACIAL FINDINGS Osseous: There are fractures of the bilateral nasal bone and anterior nasal septum with mild deviation of the nose to the right. No other acute fracture identified. No mandibular subluxation. Orbits: The globes and retro-orbital fat are preserved. Sinuses: There is blood product in the anterior nasal passages. The paranasal sinuses and mastoid air cells are otherwise clear. Soft tissues: There is laceration of the skin over the nose and laceration of the soft tissues of the left side of the nose. Multiple tiny  radiopaque fragment in the soft tissues of the left nasal fold and left upper lip concerning for retained foreign fragments. Clinical correlation recommended. CT CERVICAL SPINE FINDINGS Alignment: No acute subluxation. There is straightening of normal cervical lordosis. This may be positional or due to muscle spasm. Skull base and vertebrae: No acute fracture Soft tissues and spinal canal: No prevertebral fluid or swelling. No visible canal hematoma. Disc levels: Mild degenerative changes primarily at C4-C5 and C5-C6 with bone spurring. Upper chest: Negative. Other: Bilateral mildly enlarged cervical lymph nodes measuring up to 13 mm (46/4) on the left. This is indeterminate in etiology. Clinical correlation is recommended. There is heterogeneity of the thyroid gland with probable several nodules. Recommend thyroid US (Ref: J Am Coll Radiol. 2015 Feb;12(2): 143-50). IMPRESSION: 1. Normal unenhanced CT of the brain. 2. No acute/traumatic cervical spine pathology. 3. Fractures of the bilateral nasal bone and anterior nasal septum with mild deviation of the nose to the right. 4. Multiple tiny radiopaque fragment in the soft tissues of the left nasal fold and left upper lip concerning for retained foreign fragments. Clinical correlation recommended. 5. Heterogeneous thyroid gland with multiple nodules. Further evaluation with ultrasound on a nonemergent/outpatient basis recommended. 6. Bilateral cervical adenopathy of indeterminate etiology. Clinical correlation is recommended. Electronically Signed   By: Elgie Collard M.D.   On: 02/28/2020 21:33    Review of Systems Blood pressure (!) 144/108, pulse 94, temperature 98 F (36.7 C), temperature source Oral, resp. rate 20, height 5\' 6"  (1.676 m), weight 104.3 kg, SpO2 97 %. Physical Exam  Constitutional: She is oriented to person, place, and time. She appears well-developed and well-nourished.  HENT:  Head: Normocephalic.    Eyes: Pupils are equal, round,  and reactive to light. EOM are normal.  Cardiovascular: Normal rate.  Respiratory: Effort normal.  GI: Soft. She exhibits no distension.  Musculoskeletal:        General: Normal range of motion.  Neurological: She is alert and oriented to person, place, and time.  Skin: Skin is warm.  Psychiatric: She has a normal mood and affect. Her behavior is normal. Judgment and thought content normal.    Assessment/Plan: The lacerations were repaired.  I would like to see the patient in the office in two days to apply donated Acell to the abrasions.  Keep the area covered till then.    Derrisha Foos 03/01/2020, 9:15 AM

## 2020-03-01 NOTE — Progress Notes (Signed)
   Subjective:    Patient ID: Julie Brock, female    DOB: 08-01-1986, 34 y.o.   MRN: [id number]  The patient is a 34 year old female here for follow-up after undergoing repair of multiple severe facial lacerations.  She also has a nasal fracture.  She was playing golf when she tripped and sustained multiple facial lacerations secondary to her classes.  She was seen in the emergency room and repaired.  She had several areas where the repair was not possible because she had actually lost portion of skin.  She also tested positive for Covid while in the emergency room.  She is otherwise in good health.     Review of Systems  Constitutional: Positive for activity change. Negative for appetite change.  HENT: Positive for facial swelling.   Eyes: Negative.  Negative for photophobia and redness.  Respiratory: Negative.   Cardiovascular: Negative for leg swelling.  Gastrointestinal: Negative for abdominal distention.  Endocrine: Negative.   Genitourinary: Negative.   Musculoskeletal: Negative.   Psychiatric/Behavioral: Negative.        Objective:   Physical Exam Vitals reviewed.  HENT:     Head: Normocephalic.  Cardiovascular:     Rate and Rhythm: Normal rate.     Pulses: Normal pulses.  Pulmonary:     Effort: Pulmonary effort is normal.  Skin:    General: Skin is warm.  Neurological:     Mental Status: She is alert and oriented to person, place, and time.  Psychiatric:        Mood and Affect: Mood normal.        Behavior: Behavior normal.        Assessment & Plan:     ICD-10-CM   1. Facial laceration, initial encounter  S01.81XA   2. Closed fracture of nasal bone, initial encounter  S02.2XXA     Donated ACell was applied to her nose.  She is to use [place] dressings daily and I would like to see her back in 2 weeks.  We will discuss her nasal fracture at that time.  It would be difficult to fix it now because we would not be able to do a nasal splint due to her compromised skin.

## 2020-03-14 ENCOUNTER — Ambulatory Visit (INDEPENDENT_AMBULATORY_CARE_PROVIDER_SITE_OTHER): Payer: Managed Care, Other (non HMO) | Admitting: Surgical

## 2020-03-14 ENCOUNTER — Encounter: Payer: Self-pay | Admitting: Surgical

## 2020-03-14 ENCOUNTER — Other Ambulatory Visit: Payer: Self-pay

## 2020-03-14 VITALS — BP 127/92 | HR 73 | Temp 97.1°F

## 2020-03-14 DIAGNOSIS — S022XXA Fracture of nasal bones, initial encounter for closed fracture: Secondary | ICD-10-CM

## 2020-03-14 DIAGNOSIS — S0181XD Laceration without foreign body of other part of head, subsequent encounter: Secondary | ICD-10-CM

## 2020-03-14 NOTE — Progress Notes (Signed)
Patient ID: Julie Brock, female    DOB: 12-16-1985, 34 y.o.   MRN: [id number]  Chief Complaint  Patient presents with  . Follow-up      ICD-10-CM   1. Facial laceration, subsequent encounter  S01.81XD   2. Closed fracture of nasal bone, initial encounter  S02.2XXA     History of Present Illness: Julie Brock is a 34 y.o.  female  with a history of a mechanical fall resulting in multiple lacerations including a full-thickness laceration of her left nasal ala.  Fractures of bilateral nasal bone and anterior nasal septum with mild deviation was noted on CT maxillofacial on 02/28/2020 in the ED. Surgical intervention was postponed due to the inability to safely splint her after repair due to nasal injuries. She presents for evaluation of her repaired facial lacerations and for a preoperative evaluation/discussion about upcoming procedure, closed reduction of nasal fracture.  Patient is scheduled for 03/18/2020 with Dr. Arita Miss.  The patient has not had problems with anesthesia.  She reports a possible history of general anesthesia when she had her wisdom teeth removed.  No family history of malignant hyperthermia.  No family or personal history of DVT/PE.  No family or personal history of bleeding or clotting disorders.  Patient is not on any blood thinners.  No personal history of any cardiac or pulmonary diseases.  Job: Security guard at [employer]  No past surgical history or past medical history.  Patient reports she is healthy other than her most recent fall/nasal fracture   Past Medical History: Allergies: No Known Allergies  Current Medications:  Current Outpatient Medications:  .  amoxicillin-clavulanate (AUGMENTIN) 875-125 MG tablet, Take 1 tablet by mouth every 12 (twelve) hours., Disp: 14 tablet, Rfl: 0 .  HYDROcodone-acetaminophen (NORCO/VICODIN) 5-325 MG tablet, Take 1 tablet by mouth every 4 (four) hours as needed for severe pain., Disp: 10 tablet, Rfl: 0 .  naproxen (NAPROSYN) 500  MG tablet, Take 1 tablet (500 mg total) by mouth 2 (two) times daily., Disp: 30 tablet, Rfl: 0  Past Medical Problems: History reviewed. No pertinent past medical history.  Past Surgical History: History reviewed. No pertinent surgical history.  Social History: Social History   Socioeconomic History  . Marital status: Single    Spouse name: Not on file  . Number of children: Not on file  . Years of education: Not on file  . Highest education level: Not on file  Occupational History  . Not on file  Tobacco Use  . Smoking status: Never Smoker  . Smokeless tobacco: Never Used  Substance and Sexual Activity  . Alcohol use: Never  . Drug use: Never  . Sexual activity: Not on file  Other Topics Concern  . Not on file  Social History Narrative  . Not on file   Social Determinants of Health   Financial Resource Strain:   . Difficulty of Paying Living Expenses:   Food Insecurity:   . Worried About Programme researcher, broadcasting/film/video in the Last Year:   . Barista in the Last Year:   Transportation Needs:   . Freight forwarder (Medical):   Marland Kitchen Lack of Transportation (Non-Medical):   Physical Activity:   . Days of Exercise per Week:   . Minutes of Exercise per Session:   Stress:   . Feeling of Stress :   Social Connections:   . Frequency of Communication with Friends and Family:   . Frequency of Social Gatherings with Friends and  Family:   . Attends Religious Services:   . Active Member of Clubs or Organizations:   . Attends Banker Meetings:   Marland Kitchen Marital Status:   Intimate Partner Violence:   . Fear of Current or Ex-Partner:   . Emotionally Abused:   Marland Kitchen Physically Abused:   . Sexually Abused:     Family History: History reviewed. No pertinent family history.  Review of Systems: Review of Systems  Constitutional: Negative.   Respiratory: Negative.   Cardiovascular: Negative.   Musculoskeletal: Negative.   Neurological: Negative.     Physical  Exam: Vital Signs BP (!) 127/92 (BP Location: Left Arm, Patient Position: Sitting, Cuff Size: Large)   Pulse 73   Temp (!) 97.1 F (36.2 C) (Temporal)   LMP 02/25/2020 (Exact Date)   SpO2 94%   Physical Exam Constitutional:      General: She is not in acute distress.    Appearance: Normal appearance. She is not ill-appearing.  HENT:     Head: Normocephalic.     Nose: No congestion.     Comments: Nasal lacerations noted, healing well. Superficial wound noted near superior nasal bridge. No drainage noted. No erythema. Deviation of nasal tip to the left side. No nasal bruising noted.    Mouth/Throat:     Mouth: Mucous membranes are moist.  Eyes:     Extraocular Movements: Extraocular movements intact.     Conjunctiva/sclera: Conjunctivae normal.     Pupils: Pupils are equal, round, and reactive to light.  Cardiovascular:     Rate and Rhythm: Normal rate.  Pulmonary:     Effort: Pulmonary effort is normal. No respiratory distress.  Musculoskeletal:     Cervical back: Normal range of motion.  Skin:    General: Skin is warm and dry.  Neurological:     General: No focal deficit present.     Mental Status: She is alert and oriented to person, place, and time. Mental status is at baseline.  Psychiatric:        Mood and Affect: Mood normal.        Behavior: Behavior normal.      Assessment/Plan: The patient is scheduled for closed reduction nasal fracture.we discussed the risks, benefits, and alternatives of procedure discussed, questions answered.  Patient provided with the consent form to thoroughly read over and return at her next visit prior to surgery.  Patient is scheduled for closed reduction of nasal fracture on 03/18/2020 with Dr. Arita Miss.  Smoking Status: Non-smoker  Caprini Score: 2; Risk Factors include: Minor surgery, BMI > 25, and length of planned surgery. Recommendation for mechanical prophylaxis during surgery. Encourage early ambulation.   Pictures obtained:  03/14/2020  Post-op Rx sent to pharmacy: No prescription sent at this time, will send at next appointment prior to surgery.  Patient was provided with the General Surgical Risk consent document and Pain Medication Agreement. We discussed them in person together during this preop appointment. Patient was provided with the form to finish reading at home and return with questions at her next appointment. Recommended calling if they have any further questions prior to return appointment.  Risk consent form and Pain Medication Agreement to be scanned into patient's chart once patient has signed and returned.  Pictures were obtained of the patient and placed in the chart with the patient's or guardian's permission.    Recommend scar cream to closed nasal incisions and left medial canthal incision.  Recommend applying Vaseline daily to right nasal bridge wound.  Avoid Neosporin.  Call with questions or concerns.   Electronically signed by: Carola Rhine Iara Monds, PA-C 03/14/2020 4:07 PM

## 2020-03-15 ENCOUNTER — Other Ambulatory Visit: Payer: Self-pay

## 2020-03-15 ENCOUNTER — Encounter (HOSPITAL_BASED_OUTPATIENT_CLINIC_OR_DEPARTMENT_OTHER): Payer: Self-pay | Admitting: Plastic Surgery

## 2020-03-16 ENCOUNTER — Inpatient Hospital Stay (HOSPITAL_COMMUNITY)
Admission: RE | Admit: 2020-03-16 | Discharge: 2020-03-16 | Disposition: A | Discharge status: Home / Self Care | Payer: Managed Care, Other (non HMO) | Source: Ambulatory Visit

## 2020-03-16 NOTE — Progress Notes (Signed)
Patient is a 34 year old female with a history of a mechanical fall resulting in multiple lacerations including a full-thickness laceration of her left nasal alla.  CT on 02/28/2020 in the ED noted fractures of bilateral nasal bones and anterior nasal septum with mild deviation.  Surgical intervention was postponed due to the inability to safely splint her nose after repair due to nasal injuries.  She presented for a preoperative evaluation appointment on 03/14/2020.  At this appointment she was given a copy of the general surgical risks which she took home to review.  She presents today to complete her surgical consent form, have any remaining questions answered for her, and obtain post-op medications.  She reported she still has her pain medications from the ED and at this time does not need any additional. We can call additional in if she runs out post-operatively. She has started using mederma scar cream and noticed a burning pain with use.   On exam, forehead and facial lacerations healing well. She has a scab on right nasal bridge, healing well. No drainage noted. The scar on her left lip, just superior to the vermilion border is raised.  Dr. Arita Miss and I were both present during todays evaluation. She is doing well. We discussed post-operative protocol and all of her questions were answered.   Plan for surgery tomorrow with Dr. Arita Miss. Call with questions or concerns.

## 2020-03-16 NOTE — Progress Notes (Signed)
Patient had a positive covid test on 02/28/20, patient does not need to be retested before her surgery on 03/18/20.

## 2020-03-17 ENCOUNTER — Ambulatory Visit: Payer: Managed Care, Other (non HMO) | Admitting: Plastic Surgery

## 2020-03-17 ENCOUNTER — Ambulatory Visit (INDEPENDENT_AMBULATORY_CARE_PROVIDER_SITE_OTHER): Payer: Managed Care, Other (non HMO) | Admitting: Surgical

## 2020-03-17 ENCOUNTER — Other Ambulatory Visit: Payer: Self-pay

## 2020-03-17 ENCOUNTER — Encounter: Payer: Self-pay | Admitting: Surgical

## 2020-03-17 VITALS — BP 128/83 | HR 77 | Temp 97.8°F

## 2020-03-17 DIAGNOSIS — S022XXA Fracture of nasal bones, initial encounter for closed fracture: Secondary | ICD-10-CM | POA: Diagnosis not present

## 2020-03-17 DIAGNOSIS — S0181XD Laceration without foreign body of other part of head, subsequent encounter: Secondary | ICD-10-CM

## 2020-03-18 ENCOUNTER — Other Ambulatory Visit: Payer: Self-pay

## 2020-03-18 ENCOUNTER — Ambulatory Visit (HOSPITAL_BASED_OUTPATIENT_CLINIC_OR_DEPARTMENT_OTHER): Payer: Managed Care, Other (non HMO) | Admitting: Anesthesiology

## 2020-03-18 ENCOUNTER — Encounter (HOSPITAL_BASED_OUTPATIENT_CLINIC_OR_DEPARTMENT_OTHER)
Admission: RE | Disposition: A | Discharge status: Home / Self Care | Payer: Self-pay | Source: Home / Self Care | Attending: Plastic Surgery

## 2020-03-18 ENCOUNTER — Ambulatory Visit (HOSPITAL_BASED_OUTPATIENT_CLINIC_OR_DEPARTMENT_OTHER)
Admission: RE | Admit: 2020-03-18 | Discharge: 2020-03-18 | Disposition: A | Discharge status: Home / Self Care | Payer: Managed Care, Other (non HMO) | Attending: Plastic Surgery | Admitting: Plastic Surgery

## 2020-03-18 ENCOUNTER — Encounter (HOSPITAL_BASED_OUTPATIENT_CLINIC_OR_DEPARTMENT_OTHER): Payer: Self-pay | Admitting: Plastic Surgery

## 2020-03-18 DIAGNOSIS — Y9353 Activity, golf: Secondary | ICD-10-CM | POA: Insufficient documentation

## 2020-03-18 DIAGNOSIS — Z79899 Other long term (current) drug therapy: Secondary | ICD-10-CM | POA: Insufficient documentation

## 2020-03-18 DIAGNOSIS — Y92838 Other recreation area as the place of occurrence of the external cause: Secondary | ICD-10-CM | POA: Insufficient documentation

## 2020-03-18 DIAGNOSIS — W010XXA Fall on same level from slipping, tripping and stumbling without subsequent striking against object, initial encounter: Secondary | ICD-10-CM | POA: Diagnosis not present

## 2020-03-18 DIAGNOSIS — U071 COVID-19: Secondary | ICD-10-CM | POA: Diagnosis not present

## 2020-03-18 DIAGNOSIS — S022XXA Fracture of nasal bones, initial encounter for closed fracture: Secondary | ICD-10-CM | POA: Insufficient documentation

## 2020-03-18 DIAGNOSIS — S01511A Laceration without foreign body of lip, initial encounter: Secondary | ICD-10-CM | POA: Diagnosis not present

## 2020-03-18 DIAGNOSIS — S0181XA Laceration without foreign body of other part of head, initial encounter: Secondary | ICD-10-CM | POA: Insufficient documentation

## 2020-03-18 DIAGNOSIS — Z6834 Body mass index (BMI) 34.0-34.9, adult: Secondary | ICD-10-CM | POA: Diagnosis not present

## 2020-03-18 HISTORY — PX: CLOSED REDUCTION NASAL FRACTURE: SHX5365

## 2020-03-18 HISTORY — DX: Fracture of nasal bones, initial encounter for closed fracture: S02.2XXA

## 2020-03-18 HISTORY — DX: Laceration without foreign body of other part of head, initial encounter: S01.81XA

## 2020-03-18 LAB — POCT PREGNANCY, URINE: Preg Test, Ur: NEGATIVE

## 2020-03-18 SURGERY — CLOSED REDUCTION, FRACTURE, NASAL BONE
Anesthesia: General | Site: Nose

## 2020-03-18 MED ORDER — ONDANSETRON HCL 4 MG/2ML IJ SOLN
INTRAMUSCULAR | Status: AC
  Filled 2020-03-18: qty 2

## 2020-03-18 MED ORDER — HYDROMORPHONE HCL 1 MG/ML IJ SOLN: 0.2500 mg | INTRAMUSCULAR | Status: DC | PRN

## 2020-03-18 MED ORDER — CEFAZOLIN SODIUM-DEXTROSE 2-4 GM/100ML-% IV SOLN
2.0000 g | INTRAVENOUS | Status: AC
  Administered 2020-03-18: 2 g via INTRAVENOUS

## 2020-03-18 MED ORDER — MIDAZOLAM HCL 5 MG/5ML IJ SOLN
INTRAMUSCULAR | Status: DC | PRN
  Administered 2020-03-18: 2 mg via INTRAVENOUS

## 2020-03-18 MED ORDER — LIDOCAINE-EPINEPHRINE 1 %-1:100000 IJ SOLN
INTRAMUSCULAR | Status: AC
  Filled 2020-03-18: qty 1

## 2020-03-18 MED ORDER — DEXAMETHASONE SODIUM PHOSPHATE 10 MG/ML IJ SOLN
INTRAMUSCULAR | Status: AC
  Filled 2020-03-18: qty 1

## 2020-03-18 MED ORDER — MIDAZOLAM HCL 2 MG/2ML IJ SOLN: 0.5000 mg | Freq: Once | INTRAMUSCULAR | Status: DC | PRN

## 2020-03-18 MED ORDER — ACETAMINOPHEN 500 MG PO TABS
1000.0000 mg | ORAL_TABLET | Freq: Once | ORAL | Status: AC
  Administered 2020-03-18: 1000 mg via ORAL

## 2020-03-18 MED ORDER — LIDOCAINE 2% (20 MG/ML) 5 ML SYRINGE
INTRAMUSCULAR | Status: DC | PRN
  Administered 2020-03-18: 20 mg via INTRAVENOUS

## 2020-03-18 MED ORDER — OXYCODONE HCL 5 MG/5ML PO SOLN: 5.0000 mg | Freq: Once | ORAL | Status: DC | PRN

## 2020-03-18 MED ORDER — ACETAMINOPHEN 500 MG PO TABS
ORAL_TABLET | ORAL | Status: AC
  Filled 2020-03-18: qty 2

## 2020-03-18 MED ORDER — EPHEDRINE 5 MG/ML INJ
INTRAVENOUS | Status: AC
  Filled 2020-03-18: qty 10

## 2020-03-18 MED ORDER — DEXAMETHASONE SODIUM PHOSPHATE 4 MG/ML IJ SOLN
INTRAMUSCULAR | Status: DC | PRN
  Administered 2020-03-18: 5 mg via INTRAVENOUS

## 2020-03-18 MED ORDER — LIDOCAINE-EPINEPHRINE 1 %-1:100000 IJ SOLN
INTRAMUSCULAR | Status: DC | PRN
  Administered 2020-03-18: 10 mL

## 2020-03-18 MED ORDER — CEFAZOLIN SODIUM-DEXTROSE 2-4 GM/100ML-% IV SOLN
INTRAVENOUS | Status: AC
  Filled 2020-03-18: qty 100

## 2020-03-18 MED ORDER — FENTANYL CITRATE (PF) 100 MCG/2ML IJ SOLN
INTRAMUSCULAR | Status: AC
  Filled 2020-03-18: qty 2

## 2020-03-18 MED ORDER — OXYCODONE HCL 5 MG PO TABS: 5.0000 mg | ORAL_TABLET | Freq: Once | ORAL | Status: DC | PRN

## 2020-03-18 MED ORDER — SUCCINYLCHOLINE CHLORIDE 200 MG/10ML IV SOSY
PREFILLED_SYRINGE | INTRAVENOUS | Status: AC
  Filled 2020-03-18: qty 10

## 2020-03-18 MED ORDER — ONDANSETRON HCL 4 MG/2ML IJ SOLN
INTRAMUSCULAR | Status: DC | PRN
  Administered 2020-03-18: 4 mg via INTRAVENOUS

## 2020-03-18 MED ORDER — PROMETHAZINE HCL 25 MG/ML IJ SOLN: 6.2500 mg | INTRAMUSCULAR | Status: DC | PRN

## 2020-03-18 MED ORDER — PROPOFOL 10 MG/ML IV BOLUS
INTRAVENOUS | Status: AC
  Filled 2020-03-18: qty 20

## 2020-03-18 MED ORDER — SCOPOLAMINE 1 MG/3DAYS TD PT72: 1.0000 | MEDICATED_PATCH | Freq: Once | TRANSDERMAL | Status: DC

## 2020-03-18 MED ORDER — MEPERIDINE HCL 25 MG/ML IJ SOLN: 6.2500 mg | INTRAMUSCULAR | Status: DC | PRN

## 2020-03-18 MED ORDER — LIDOCAINE 2% (20 MG/ML) 5 ML SYRINGE
INTRAMUSCULAR | Status: AC
  Filled 2020-03-18: qty 5

## 2020-03-18 MED ORDER — LACTATED RINGERS IV SOLN: INTRAVENOUS | Status: DC

## 2020-03-18 MED ORDER — FENTANYL CITRATE (PF) 100 MCG/2ML IJ SOLN
INTRAMUSCULAR | Status: DC | PRN
  Administered 2020-03-18: 100 ug via INTRAVENOUS

## 2020-03-18 MED ORDER — PHENYLEPHRINE 40 MCG/ML (10ML) SYRINGE FOR IV PUSH (FOR BLOOD PRESSURE SUPPORT)
PREFILLED_SYRINGE | INTRAVENOUS | Status: AC
  Filled 2020-03-18: qty 10

## 2020-03-18 MED ORDER — PROPOFOL 10 MG/ML IV BOLUS
INTRAVENOUS | Status: DC | PRN
  Administered 2020-03-18: 250 mg via INTRAVENOUS

## 2020-03-18 SURGICAL SUPPLY — 52 items
APPLICATOR COTTON TIP 6 STRL (MISCELLANEOUS) ×1 IMPLANT
APPLICATOR COTTON TIP 6IN STRL (MISCELLANEOUS) ×3
BENZOIN TINCTURE PRP APPL 2/3 (GAUZE/BANDAGES/DRESSINGS) ×3 IMPLANT
BLADE SURG 15 STRL LF DISP TIS (BLADE) IMPLANT
BLADE SURG 15 STRL SS (BLADE)
CANISTER SUCT 1200ML W/VALVE (MISCELLANEOUS) ×3 IMPLANT
CLOSURE WOUND 1/2 X4 (GAUZE/BANDAGES/DRESSINGS) ×1
COVER BACK TABLE 60X90IN (DRAPES) ×3 IMPLANT
COVER MAYO STAND STRL (DRAPES) ×3 IMPLANT
COVER WAND RF STERILE (DRAPES) IMPLANT
DECANTER SPIKE VIAL GLASS SM (MISCELLANEOUS) IMPLANT
DERMABOND ADVANCED (GAUZE/BANDAGES/DRESSINGS)
DERMABOND ADVANCED .7 DNX12 (GAUZE/BANDAGES/DRESSINGS) IMPLANT
DRSG NASOPORE 8CM (GAUZE/BANDAGES/DRESSINGS) IMPLANT
ELECT NEEDLE BLADE 2-5/6 (NEEDLE) IMPLANT
ELECT REM PT RETURN 9FT ADLT (ELECTROSURGICAL)
ELECTRODE REM PT RTRN 9FT ADLT (ELECTROSURGICAL) IMPLANT
GAUZE VASELINE FOILPK 1/2 X 72 (GAUZE/BANDAGES/DRESSINGS) IMPLANT
GLOVE BIO SURGEON STRL SZ 6.5 (GLOVE) ×2 IMPLANT
GLOVE BIO SURGEON STRL SZ7.5 (GLOVE) IMPLANT
GLOVE BIO SURGEONS STRL SZ 6.5 (GLOVE) ×1
GLOVE BIOGEL M STRL SZ7.5 (GLOVE) ×3 IMPLANT
GLOVE BIOGEL PI IND STRL 8 (GLOVE) ×1 IMPLANT
GLOVE BIOGEL PI INDICATOR 8 (GLOVE) ×2
GLOVE ECLIPSE 6.5 STRL STRAW (GLOVE) ×3 IMPLANT
GOWN STRL REUS W/ TWL LRG LVL3 (GOWN DISPOSABLE) ×3 IMPLANT
GOWN STRL REUS W/TWL LRG LVL3 (GOWN DISPOSABLE) ×9
KIT SPLINT NASAL DENVER LRG BE (GAUZE/BANDAGES/DRESSINGS) IMPLANT
KIT SPLINT NASAL DENVER MIN BE (GAUZE/BANDAGES/DRESSINGS) IMPLANT
KIT SPLINT NASAL DENVER PET BE (GAUZE/BANDAGES/DRESSINGS) IMPLANT
KIT SPLINT NASAL DENVER SM BEI (GAUZE/BANDAGES/DRESSINGS) ×3 IMPLANT
NEEDLE PRECISIONGLIDE 27X1.5 (NEEDLE) ×3 IMPLANT
PATTIES SURGICAL .5 X3 (DISPOSABLE) ×3 IMPLANT
PENCIL SMOKE EVACUATOR (MISCELLANEOUS) IMPLANT
SET BASIN DAY SURGERY F.S. (CUSTOM PROCEDURE TRAY) ×3 IMPLANT
SHEET MEDIUM DRAPE 40X70 STRL (DRAPES) ×3 IMPLANT
SPLINT NASAL AIRWAY SILICONE (MISCELLANEOUS) IMPLANT
SPLINT NASAL THERMO PLAST (MISCELLANEOUS) IMPLANT
SPONGE GAUZE 2X2 8PLY STER LF (GAUZE/BANDAGES/DRESSINGS)
SPONGE GAUZE 2X2 8PLY STRL LF (GAUZE/BANDAGES/DRESSINGS) IMPLANT
STRIP CLOSURE SKIN 1/2X4 (GAUZE/BANDAGES/DRESSINGS) ×2 IMPLANT
SUT CHROMIC 6 0 PS 4 (SUTURE) IMPLANT
SUT ETHILON 3 0 PS 1 (SUTURE) IMPLANT
SUT SILK 2 0 PERMA HAND 18 BK (SUTURE) IMPLANT
SUT SILK 3 0 SH 30 (SUTURE) IMPLANT
SYR CONTROL 10ML LL (SYRINGE) ×3 IMPLANT
TOWEL GREEN STERILE FF (TOWEL DISPOSABLE) ×3 IMPLANT
TRAY DSU PREP LF (CUSTOM PROCEDURE TRAY) IMPLANT
TUBE CONNECTING 20'X1/4 (TUBING) ×1
TUBE CONNECTING 20X1/4 (TUBING) ×2 IMPLANT
TUBE SALEM SUMP 16 FR W/ARV (TUBING) IMPLANT
YANKAUER SUCT BULB TIP NO VENT (SUCTIONS) IMPLANT

## 2020-03-18 NOTE — Interval H&P Note (Signed)
History and Physical Interval Note:  03/18/2020 12:40 PM  Julie Brock  has presented today for surgery, with the diagnosis of nasal fracture.  The various methods of treatment have been discussed with the patient and family. After consideration of risks, benefits and other options for treatment, the patient has consented to  Procedure(s): CLOSED REDUCTION NASAL FRACTURE (N/A) as a surgical intervention.  The patient's history has been reviewed, patient examined, no change in status, stable for surgery.  I have reviewed the patient's chart and labs.  Questions were answered to the patient's satisfaction.     Allena Napoleon

## 2020-03-18 NOTE — Brief Op Note (Signed)
03/18/2020  3:46 PM  PATIENT:  Julie Brock  34 y.o. female  PRE-OPERATIVE DIAGNOSIS:  nasal fracture  POST-OPERATIVE DIAGNOSIS:  nasal fracture  PROCEDURE:  Procedure(s): CLOSED REDUCTION NASAL FRACTURE (N/A)  SURGEON:  Surgeon(s) and Role:    * Rigby Leonhardt, Wendy Poet, MD - Primary  PHYSICIAN ASSISTANT:  Johanna young, PA  ASSISTANTS: none   ANESTHESIA:   general  EBL:  1 mL   BLOOD ADMINISTERED:none  DRAINS: none   LOCAL MEDICATIONS USED:  LIDOCAINE   SPECIMEN:  No Specimen  DISPOSITION OF SPECIMEN:  N/A  COUNTS:  YES  TOURNIQUET:  * No tourniquets in log *  DICTATION: .Dragon Dictation  PLAN OF CARE: Discharge to home after PACU  PATIENT DISPOSITION:  PACU   Delay start of Pharmacological VTE agent (>24hrs) due to surgical blood loss or risk of bleeding: not applicable

## 2020-03-18 NOTE — Anesthesia Postprocedure Evaluation (Addendum)
Anesthesia Post Note  Patient: Horticulturist, commercial  Procedure(s) Performed: CLOSED REDUCTION NASAL FRACTURE (N/A Nose)     Patient location during evaluation: PACU Anesthesia Type: General Level of consciousness: awake and alert, patient cooperative and oriented Pain management: pain level controlled Vital Signs Assessment: post-procedure vital signs reviewed and stable Respiratory status: spontaneous breathing, nonlabored ventilation and respiratory function stable Cardiovascular status: blood pressure returned to baseline and stable Postop Assessment: no apparent nausea or vomiting Anesthetic complications: no   No complications documented.  Last Vitals:  Vitals:   03/18/20 1530 03/18/20 1545  BP: 119/77   Pulse: 60 63  Resp: 11 15  Temp:    SpO2: 100% 100%    Last Pain:  Vitals:   03/18/20 1157  TempSrc: Oral  PainSc: 0-No pain                 Julie Brock,E. Glorine Hanratty

## 2020-03-18 NOTE — Transfer of Care (Signed)
Immediate Anesthesia Transfer of Care Note  Patient: Julie Brock  Procedure(s) Performed: CLOSED REDUCTION NASAL FRACTURE (N/A Nose)  Patient Location: PACU  Anesthesia Type:General  Level of Consciousness: sedated  Airway & Oxygen Therapy: Patient Spontanous Breathing and Patient connected to face mask oxygen  Post-op Assessment: Report given to RN and Post -op Vital signs reviewed and stable  Post vital signs: Reviewed and stable  Last Vitals:  Vitals Value Taken Time  BP    Temp    Pulse 77 03/18/20 1507  Resp 18 03/18/20 1507  SpO2 99 % 03/18/20 1507  Vitals shown include unvalidated device data.  Last Pain:  Vitals:   03/18/20 1157  TempSrc: Oral  PainSc: 0-No pain         Complications: No complications documented.

## 2020-03-18 NOTE — Anesthesia Procedure Notes (Signed)
Procedure Name: LMA Insertion Date/Time: 03/18/2020 2:33 PM Performed by: Ronnette Hila, CRNA Pre-anesthesia Checklist: Patient identified, Emergency Drugs available, Suction available and Patient being monitored Patient Re-evaluated:Patient Re-evaluated prior to induction Oxygen Delivery Method: Circle system utilized Preoxygenation: Pre-oxygenation with 100% oxygen Induction Type: IV induction Ventilation: Mask ventilation without difficulty LMA: LMA flexible inserted LMA Size: 4.0 Number of attempts: 1 Airway Equipment and Method: Bite block Placement Confirmation: positive ETCO2 Tube secured with: Tape Dental Injury: Teeth and Oropharynx as per pre-operative assessment

## 2020-03-18 NOTE — Anesthesia Preprocedure Evaluation (Addendum)
Anesthesia Evaluation  Patient identified by MRN, date of birth, ID band Patient awake    Reviewed: Allergy & Precautions, NPO status , Patient's Chart, lab work & pertinent test results  History of Anesthesia Complications Negative for: history of anesthetic complications  Airway Mallampati: II  TM Distance: >3 FB Neck ROM: Full    Dental  (+) Dental Advisory Given, Chipped   Pulmonary neg pulmonary ROS,  02/28/2020 SARS coronavirus POS   breath sounds clear to auscultation       Cardiovascular negative cardio ROS   Rhythm:Regular Rate:Normal     Neuro/Psych negative neurological ROS     GI/Hepatic negative GI ROS, Neg liver ROS,   Endo/Other  Morbid obesity  Renal/GU negative Renal ROS     Musculoskeletal   Abdominal (+) + obese,   Peds  Hematology negative hematology ROS (+)   Anesthesia Other Findings   Reproductive/Obstetrics                            Anesthesia Physical Anesthesia Plan  ASA: II  Anesthesia Plan: General   Post-op Pain Management:    Induction: Intravenous  PONV Risk Score and Plan: 3 and Ondansetron and Dexamethasone  Airway Management Planned: LMA  Additional Equipment: None  Intra-op Plan:   Post-operative Plan: Extubation in OR  Informed Consent: I have reviewed the patients History and Physical, chart, labs and discussed the procedure including the risks, benefits and alternatives for the proposed anesthesia with the patient or authorized representative who has indicated his/her understanding and acceptance.     Dental advisory given  Plan Discussed with: CRNA and Surgeon  Anesthesia Plan Comments:        Anesthesia Quick Evaluation

## 2020-03-18 NOTE — Op Note (Signed)
Operative Note   DATE OF OPERATION: 03/18/2020  SURGICAL DEPARTMENT: Plastic Surgery  PREOPERATIVE DIAGNOSES: Displaced nasal fracture  POSTOPERATIVE DIAGNOSES:  same  PROCEDURE: Closed reduction of nasal fracture with splinting  SURGEON: Ancil Linsey, MD  ASSISTANT: Joni Fears, PA  ANESTHESIA:  General.   COMPLICATIONS: None.   INDICATIONS FOR PROCEDURE:  The patient, Julie Brock is a 34 y.o. female born on 1985-11-08, is here for treatment of displaced nasal fracture MRN: [id number]  CONSENT:  Informed consent was obtained directly from the patient. Risks, benefits and alternatives were fully discussed. Specific risks including but not limited to bleeding, infection, hematoma, seroma, scarring, pain, contracture, asymmetry, wound healing problems, and need for further surgery were all discussed. The patient did have an ample opportunity to have questions answered to satisfaction.   DESCRIPTION OF PROCEDURE:  The patient was taken to the operating room. SCDs were placed and antibiotics were given.  General anesthesia was administered.  The patient's operative site was prepped and draped in a sterile fashion. A time out was performed and all information was confirmed to be correct.  I started by infiltrating some of the surrounding areas with lidocaine with epinephrine.  Lidocaine with epi soaked pledgets were also placed intranasally and left in place for period of time.  After this was all given time to work I butter knife was used to realign the nasal dorsum.  The bony vault had been displaced to the patient's right side and this was corrected nicely.  A Denver splint was then applied.  The patient tolerated the procedure well.  There were no complications. The patient was allowed to wake from anesthesia, extubated and taken to the recovery room in satisfactory condition.

## 2020-03-18 NOTE — Discharge Instructions (Signed)
  Post Anesthesia Home Care Instructions  Activity: Get plenty of rest for the remainder of the day. A responsible individual must stay with you for 24 hours following the procedure.  For the next 24 hours, DO NOT: -Drive a car -Advertising copywriter -Drink alcoholic beverages -Take any medication unless instructed by your physician -Make any legal decisions or sign important papers.  Meals: Start with liquid foods such as gelatin or soup. Progress to regular foods as tolerated. Avoid greasy, spicy, heavy foods. If nausea and/or vomiting occur, drink only clear liquids until the nausea and/or vomiting subsides. Call your physician if vomiting continues.  Special Instructions/Symptoms: Your throat may feel dry or sore from the anesthesia or the breathing tube placed in your throat during surgery. If this causes discomfort, gargle with warm salt water. The discomfort should disappear within 24 hours.  If you had a scopolamine patch placed behind your ear for the management of post- operative nausea and/or vomiting:  1. The medication in the patch is effective for 72 hours, after which it should be removed.  Wrap patch in a tissue and discard in the trash. Wash hands thoroughly with soap and water. 2. You may remove the patch earlier than 72 hours if you experience unpleasant side effects which may include dry mouth, dizziness or visual disturbances. 3. Avoid touching the patch. Wash your hands with soap and water after contact with the patch.     Activity: As tolerated, but avoid strenuous activity until follow up visit.  Diet: Regular  Wound Care: Keep dressing clean & dry until nasal splint removed (~ 14 days).  After that you can shower normally.  Redress the wound as needed for comfort.  Special Instructions: Nasal splint will be removed in the office in approximately 2 weeks. If needed, blow nose very gently.  Call our office if any unusual problems occur such as pain,  excessive bleeding, unrelieved nausea/vomiting, fever &/or chills.  Follow-up appointment: Scheduled for 6/25.  Pain Management Plan: 1) Ibuprofen 800 mg every 6 hours OR may use naproxen as prescribed          If still having pain... Add 2) Tylenol 500 mg every 6 hours           If still in pain... Add 3) Rx pain medication - Hydorcodone         Recommend @ night         May use up to every 4 hours if needed for severe pain   You may alternate between Ibuprofen and Tylenol taking one every 3 hours if desired.

## 2020-03-21 ENCOUNTER — Encounter (HOSPITAL_BASED_OUTPATIENT_CLINIC_OR_DEPARTMENT_OTHER): Payer: Self-pay | Admitting: Plastic Surgery

## 2020-03-25 ENCOUNTER — Encounter: Payer: Managed Care, Other (non HMO) | Admitting: Plastic Surgery

## 2020-03-29 ENCOUNTER — Telehealth: Payer: Self-pay | Admitting: Plastic Surgery

## 2020-03-29 NOTE — Progress Notes (Signed)
   Subjective:     Patient ID: Julie Brock, female    DOB: 1986/04/02, 34 y.o.   MRN: [id number]  Chief Complaint  Patient presents with  . Post-op Follow-up    HPI: The patient is a 34 y.o. female here for follow-up after undergoing a closed reduction of nasal fracture with splinting on 03/18/2020 with Dr. Arita Miss.  Patient reports that her Denver nasal splint fell off yesterday.    Review of Systems  Constitutional: Negative for chills and fever.  HENT: Positive for congestion (some difficulty with blowing her nose). Negative for nosebleeds and rhinorrhea.   Respiratory: Negative for shortness of breath.   Cardiovascular: Negative for chest pain.  Gastrointestinal: Negative for constipation, diarrhea, nausea and vomiting.  Skin: Negative for color change, pallor and rash.     Objective:   Vital Signs BP 116/77 (BP Location: Left Arm, Patient Position: Sitting, Cuff Size: Large)   Pulse 74   Temp 98 F (36.7 C) (Temporal)   LMP 03/21/2020 (Exact Date)   SpO2 97%  Vital Signs and Nursing Note Reviewed  Physical Exam Constitutional:      General: She is not in acute distress.    Appearance: Normal appearance.  HENT:     Head: Normocephalic.     Nose:     Comments: Left side of nose curves inward slightly. Incisions/Lacerations all healing well, c/d/i. No signs of infection, redness, drainage, seroma/hematoma. Denver splint and adhesive strips no longer in place. Cardiovascular:     Rate and Rhythm: Normal rate.  Pulmonary:     Effort: Pulmonary effort is normal.  Musculoskeletal:        General: Normal range of motion.     Cervical back: Normal range of motion.  Skin:    General: Skin is warm and dry.     Coloration: Skin is not pale.     Findings: No erythema or rash.  Neurological:     Mental Status: She is alert and oriented to person, place, and time.     Gait: Gait is intact.  Psychiatric:        Mood and Affect: Mood and affect normal.        Cognition and  Memory: Memory normal.        Judgment: Judgment normal.         Pictures were obtained of the patient and placed in the chart with the patient's or guardian's permission.     Assessment/Plan:     ICD-10-CM   1. Closed fracture of nasal bone, initial encounter  S02.2XXA     Left nasal bone has inward curve appearance. Take care when blowing nose. Try to be careful to not allow nose to get bumped since splint is off. Bone will need some time to heal before determining if any additional intervention is needed.   Follow up in 4-6 weeks. Call office with any questions/concerns.  The 21st Century Cures Act was signed into law in 2016 which includes the topic of electronic health records.  This provides immediate access to information in MyChart.  This includes consultation notes, operative notes, office notes, lab results and pathology reports.  If you have any questions about what you read please let us know at your next visit or call us at the office.  We are right here with you.   Eldridge Abrahams, PA-C 03/30/2020, 10:32 AM

## 2020-03-29 NOTE — Telephone Encounter (Signed)
Patient lvm to say that the nose brace fell off yesterday and she wanted to know if that was supposed to happen. Please call to advise if it's normal and whether she still needs to keep appt tomorrow morning. [phone] (cell) or #[phone] (home).

## 2020-03-30 ENCOUNTER — Other Ambulatory Visit: Payer: Self-pay

## 2020-03-30 ENCOUNTER — Ambulatory Visit (INDEPENDENT_AMBULATORY_CARE_PROVIDER_SITE_OTHER): Payer: Managed Care, Other (non HMO) | Admitting: Plastic Surgery

## 2020-03-30 ENCOUNTER — Encounter: Payer: Self-pay | Admitting: Plastic Surgery

## 2020-03-30 VITALS — BP 116/77 | HR 74 | Temp 98.0°F

## 2020-03-30 DIAGNOSIS — S022XXA Fracture of nasal bones, initial encounter for closed fracture: Secondary | ICD-10-CM

## 2020-04-01 NOTE — Telephone Encounter (Signed)
Patient was seen in the office on (03/30/20) by Johanna,PA-C.//AB/CMA

## 2020-05-05 ENCOUNTER — Other Ambulatory Visit: Payer: Self-pay

## 2020-05-05 ENCOUNTER — Encounter: Payer: Self-pay | Admitting: Plastic Surgery

## 2020-05-05 ENCOUNTER — Ambulatory Visit (INDEPENDENT_AMBULATORY_CARE_PROVIDER_SITE_OTHER): Payer: Managed Care, Other (non HMO) | Admitting: Plastic Surgery

## 2020-05-05 VITALS — BP 105/71 | HR 79 | Temp 97.8°F | Ht 66.0 in | Wt 215.0 lb

## 2020-05-05 DIAGNOSIS — S022XXA Fracture of nasal bones, initial encounter for closed fracture: Secondary | ICD-10-CM

## 2020-05-05 NOTE — Progress Notes (Signed)
Patient is here postop from closed reduction of a left-sided nasal fracture.  She feels like this has collapsed a little bit since the reduction and on exam it does look like the left side is flatter than the right side.  Her scars from the soft tissue lacerations are healing appropriately.  She does report that she does not feel like she can breathe quite as well on the left side as she could preoperatively.  Cottle maneuver does make this better.  She does have regular sinus issues and has sinus trouble year-round because of allergies.  For the time being I recommended daily over-the-counter Flonase nasal spray and I will plan to see her again in 2 months.  If she does end up having persistent issues with breathing on that side she may need a referral to an ENT for evaluation of that.  She is in agreement and all of her questions were answered.

## 2020-06-30 ENCOUNTER — Ambulatory Visit: Payer: Managed Care, Other (non HMO) | Admitting: Surgical

## 2020-07-26 ENCOUNTER — Ambulatory Visit (INDEPENDENT_AMBULATORY_CARE_PROVIDER_SITE_OTHER): Payer: Managed Care, Other (non HMO) | Admitting: Surgical

## 2020-07-26 ENCOUNTER — Encounter: Payer: Self-pay | Admitting: Surgical

## 2020-07-26 ENCOUNTER — Other Ambulatory Visit: Payer: Self-pay

## 2020-07-26 VITALS — BP 123/83 | HR 83 | Temp 97.9°F | Ht 66.0 in | Wt 216.0 lb

## 2020-07-26 DIAGNOSIS — S022XXA Fracture of nasal bones, initial encounter for closed fracture: Secondary | ICD-10-CM | POA: Diagnosis not present

## 2020-07-26 NOTE — Progress Notes (Signed)
   Subjective:     Patient ID: Julie Brock, female    DOB: [birth year], 34 y.o.   MRN: [id number]  Chief Complaint  Patient presents with  . Follow-up    HPI: The patient is a 34 y.o. female here for follow-up after closed reduction of left-sided nasal fracture with Dr. Arita Miss on 03/18/2020.  At her last visit on 05/05/2020 patient had some collapse of the left side and some deficit in breathing of the left nostril.  It was recommended patient start over-the-counter Flonase nasal spray and follow-up in 2 months for reevaluation.  Patient reports that since her last visit approximately 2 months ago she has noticed some improvement in her breathing, she reports it is difficult to determine her baseline because she does not know her baseline as she has chronic sinus issues that fluctuate throughout the year.  She is otherwise doing well.   Review of Systems  Constitutional: Negative.   Skin: Negative.      Objective:   Vital Signs BP 123/83 (BP Location: Left Arm, Patient Position: Sitting, Cuff Size: Large)   Pulse 83   Temp 97.9 F (36.6 C) (Temporal)   Ht 5\' 6"  (1.676 m)   Wt 216 lb (98 kg)   LMP 07/09/2020 (Exact Date)   SpO2 98%   BMI 34.86 kg/m  Vital Signs and Nursing Note Reviewed  Physical Exam Constitutional:      General: She is not in acute distress.    Appearance: Normal appearance. She is not ill-appearing.  HENT:     Nose: No nasal tenderness.     Right Nostril: No occlusion.     Left Nostril: No occlusion.     Left Sinus: No maxillary sinus tenderness.     Comments: Nasal lacerations noted, healing well, the laceration on the nasal bridge is slightly hypertrophic.  She has some contracture of the left nasal ala laceration.  Bilateral nares are patent Pulmonary:     Effort: Pulmonary effort is normal.  Skin:    General: Skin is warm and dry.  Neurological:     General: No focal deficit present.     Mental Status: She is alert and oriented to person, place, and  time.  Psychiatric:        Mood and Affect: Mood normal.        Behavior: Behavior normal.     Assessment/Plan:     ICD-10-CM   1. Closed fracture of nasal bone, initial encounter  S02.2XXA Ambulatory referral to ENT   Patient reports a slight improvement in the breathing from her left nare, she is unsure if she is at her baseline prior to injury or not.  She has a chronic history of sinus issues and does not recall what her actual baseline is.  She previously discussed ENT referral with Dr. 09/08/2020 at her last visit and reports that at this time she would be happy to be referred to ENT. She does report that this may be something she does in the future due to financial reasons.  I answered all of her questions to her content.  I recommend she call Arita Miss if she has any questions or concerns, we can follow-up as needed.   [place] Isadora Delorey, PA-C 07/26/2020, 11:00 AM

## 2020-09-19 ENCOUNTER — Other Ambulatory Visit: Payer: Self-pay

## 2020-09-19 ENCOUNTER — Encounter (INDEPENDENT_AMBULATORY_CARE_PROVIDER_SITE_OTHER): Payer: Self-pay | Admitting: Otolaryngology

## 2020-09-19 ENCOUNTER — Ambulatory Visit (INDEPENDENT_AMBULATORY_CARE_PROVIDER_SITE_OTHER): Payer: Managed Care, Other (non HMO) | Admitting: Otolaryngology

## 2020-09-19 VITALS — Temp 97.3°F

## 2020-09-19 DIAGNOSIS — J342 Deviated nasal septum: Secondary | ICD-10-CM

## 2020-09-19 DIAGNOSIS — J3489 Other specified disorders of nose and nasal sinuses: Secondary | ICD-10-CM | POA: Diagnosis not present

## 2020-09-19 DIAGNOSIS — S022XXS Fracture of nasal bones, sequela: Secondary | ICD-10-CM | POA: Diagnosis not present

## 2020-09-19 NOTE — Progress Notes (Addendum)
HPI: Julie Brock is a 34 y.o. female who presents is referred by Keenan Bachelor, PA for evaluation of trouble breathing through the left side of her nose.  She apparently sustained trauma to her nose on 02/28/2020 of this past year when she fell and sustained nasal fracture and nasal lacerations on the left side.  I reviewed the CT scan that showed nasal bone fractures as well as septal fracture with a nose deviated to the right and obstruction of the left nasal passageway.  The nose was repaired by Dr. Ulice Bold acutely in the ER and was subsequently taken back to the operating room a month later for repair of the nasal fracture by an associate physician. Since the trauma she has had chronic trouble breathing through the left side of her nose compared to the right.  Past Medical History:  Diagnosis Date  . Facial laceration   . Nasal fracture    Past Surgical History:  Procedure Laterality Date  . CLOSED REDUCTION NASAL FRACTURE N/A 03/18/2020   Procedure: CLOSED REDUCTION NASAL FRACTURE;  Surgeon: Allena Napoleon, MD;  Location: [facility];  Service: Plastics;  Laterality: N/A;  . WISDOM TOOTH EXTRACTION     Social History   Socioeconomic History  . Marital status: Single    Spouse name: Not on file  . Number of children: Not on file  . Years of education: Not on file  . Highest education level: Not on file  Occupational History  . Not on file  Tobacco Use  . Smoking status: Never Smoker  . Smokeless tobacco: Never Used  Substance and Sexual Activity  . Alcohol use: Never  . Drug use: Never  . Sexual activity: Not Currently    Birth control/protection: None  Other Topics Concern  . Not on file  Social History Narrative  . Not on file   Social Determinants of Health   Financial Resource Strain: Not on file  Food Insecurity: Not on file  Transportation Needs: Not on file  Physical Activity: Not on file  Stress: Not on file  Social Connections: Not on file    No family history on file. No Known Allergies Prior to Admission medications   Medication Sig Start Date End Date Taking? Authorizing Provider  acetaminophen (TYLENOL) 325 MG tablet Take 500 mg by mouth every 6 (six) hours as needed. Patient not taking: Reported on 09/19/2020    [provider]  HYDROcodone-acetaminophen (NORCO/VICODIN) 5-325 MG tablet Take 1 tablet by mouth every 4 (four) hours as needed for severe pain. Patient not taking: Reported on 09/19/2020 02/29/20   Mannie Stabile, PA-C  naproxen (NAPROSYN) 500 MG tablet Take 1 tablet (500 mg total) by mouth 2 (two) times daily. Patient not taking: Reported on 09/19/2020 02/29/20   Mannie Stabile, PA-C     Positive ROS: Otherwise negative  All other systems have been reviewed and were otherwise negative with the exception of those mentioned in the HPI and as above.  Physical Exam: Constitutional: Alert, well-appearing, no acute distress Ears: External ears without lesions or tenderness. Ear canals are clear bilaterally with intact, clear TMs.  Nasal: Externally patient has a well-healing incision on the lateral nose a extends up through the region of the lower lateral cartilage.  She also has a scar inferior within the nostril and on exam has slight narrowness of the left nostril compared to the right.  She has slight septal deviation to the left and she has some collapse of the  left nasal valve region on inhalation. Oral: Lips and gums without lesions. Tongue and palate mucosa without lesions. Posterior oropharynx clear. Neck: No palpable adenopathy or masses Respiratory: Breathing comfortably  Skin: No facial/neck lesions or rash noted.  Procedures  Assessment: History of nasal trauma status post primary repair as well as close reduction nasal fracture.  She has persistent problems with more trouble breathing through the left side compared to the right.  Plan: On examination she has some narrowness of  the left nasal valve as well as some slight collapse of the left nasal valve and a slight septal deviation to the left. Would recommend initially regular use of nasal steroid spray either Nasacort or Flonase 2 sprays each nostril at night as this will help with reducing swelling of the mucous membranes.  She would probably benefit from revision surgery including septoplasty with placement of a cartilaginous nasal valve stent and perhaps enlarging the left nostril slightly with either skin graft or rotational flap.  But initially recommended regular use of nasal steroid spray if she has persistent problems with trouble breathing through the left side could consider revision surgery. She presently takes Lortab as well as using Flonase. I gave her some information on the air max nasal device that will help improve airflow through the left nostril.   Narda Bonds, MD   CC:
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
